# Patient Record
Sex: Male | Born: 1970 | Race: White | Hispanic: No | Marital: Single | Smoking: Former smoker
Health system: Southern US, Community
[De-identification: ages and names within clinical notes are randomized; demographics above are authoritative.]

## PROBLEM LIST (undated history)

## (undated) DIAGNOSIS — I351 Nonrheumatic aortic (valve) insufficiency: Secondary | ICD-10-CM

## (undated) DIAGNOSIS — M1710 Unilateral primary osteoarthritis, unspecified knee: Secondary | ICD-10-CM

## (undated) DIAGNOSIS — R011 Cardiac murmur, unspecified: Secondary | ICD-10-CM

## (undated) DIAGNOSIS — L309 Dermatitis, unspecified: Secondary | ICD-10-CM

## (undated) DIAGNOSIS — G43909 Migraine, unspecified, not intractable, without status migrainosus: Secondary | ICD-10-CM

## (undated) DIAGNOSIS — M5030 Other cervical disc degeneration, unspecified cervical region: Secondary | ICD-10-CM

## (undated) DIAGNOSIS — M179 Osteoarthritis of knee, unspecified: Secondary | ICD-10-CM

## (undated) DIAGNOSIS — Z9889 Other specified postprocedural states: Secondary | ICD-10-CM

## (undated) HISTORY — DX: Other cervical disc degeneration, unspecified cervical region: M50.30

## (undated) HISTORY — DX: Cardiac murmur, unspecified: R01.1

## (undated) HISTORY — DX: Osteoarthritis of knee, unspecified: M17.9

## (undated) HISTORY — PX: TONSILECTOMY/ADENOIDECTOMY WITH MYRINGOTOMY: SHX6125

## (undated) HISTORY — DX: Nonrheumatic aortic (valve) insufficiency: I35.1

## (undated) HISTORY — DX: Migraine, unspecified, not intractable, without status migrainosus: G43.909

## (undated) HISTORY — DX: Other specified postprocedural states: Z98.890

## (undated) HISTORY — DX: Unilateral primary osteoarthritis, unspecified knee: M17.10

## (undated) HISTORY — PX: CHOLECYSTECTOMY: SHX55

## (undated) HISTORY — DX: Dermatitis, unspecified: L30.9

## (undated) HISTORY — PX: LASIK: SHX215

---

## 2013-02-24 HISTORY — PX: KNEE ARTHROPLASTY: SHX992

## 2013-02-24 HISTORY — PX: KNEE ARTHROSCOPY: SUR90

## 2014-04-14 ENCOUNTER — Inpatient Hospital Stay
Admit: 2014-04-14 | Discharge: 2014-04-15 | Disposition: A | Discharge status: Home / Self Care | Payer: BLUE CROSS/BLUE SHIELD | Attending: Emergency Medicine

## 2014-04-14 DIAGNOSIS — K5289 Other specified noninfective gastroenteritis and colitis: Secondary | ICD-10-CM

## 2014-04-14 NOTE — ED Provider Notes (Signed)
HPI Comments: Jacob Olson is a 44 y.o. male who presents ambulatory to [facility] ED with cc of N/V/D with associated body aches x yesterday. Pt also complains of chills beginning today and reports seeing [facility], at which time he had a fever of 101C and was referred to [facility] ED. Pt has been on narcotics post operatively, had his 1st BM yesterday and since then has experienced non-bloody diarrhea.  Pt reports he had BL knee surgery for meniscal repair on 04/11/14 by Dr. Rosana Hoes. Pt denies any changes to his knee such as increased erythema, warmth, pain or swelling, and notes he can ambulate without pain at home. Pt denies any hematochezia, melena, hematemesis, cough, cold, or urinary symptoms.    PCP: Roney Marion, MD  PMHx significant for: None on file  PSHx significant for: Cholecystectomy, HEENT, T&A, BL knee scope and meniscus transplant  Social Hx: -tobacco, -EtOH, -drugs    There are no other complaints, changes or physical findings at this time.    The history is provided by the patient.        History reviewed. No pertinent past medical history.    Past Surgical History:   Procedure Laterality Date   ??? Hx cholecystectomy     ??? Hx heent     ??? Hx tonsil and adenoidectomy Bilateral    ??? 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 No pertinent family history.    History     Social History   ??? Marital Status: SINGLE     Spouse Name: N/A   ??? Number of Children: N/A   ??? Years of Education: N/A     Occupational History   ??? Not on file.     Social History Main Topics   ??? Smoking status: Former Smoker   ??? Smokeless tobacco: Never Used   ??? Alcohol Use: No   ??? Drug Use: No   ??? Sexual Activity: Not on file     Other Topics Concern   ??? Not on file     Social History Narrative   ??? No narrative on file           ALLERGIES: Review of patient's allergies indicates no known allergies.      Review of Systems    Constitutional: Positive for fever and chills. Negative for activity change and appetite change.   HENT: Negative for congestion, rhinorrhea, sinus pressure, sneezing and sore throat.    Eyes: Negative for pain, discharge and visual disturbance.   Respiratory: Negative for cough and shortness of breath.    Cardiovascular: Negative for chest pain.   Gastrointestinal: Positive for nausea, vomiting and diarrhea. Negative for abdominal pain and blood in stool.   Genitourinary: Negative for dysuria, urgency, frequency, hematuria, flank pain and difficulty urinating.   Musculoskeletal: Positive for myalgias (generalized). Negative for back pain, joint swelling, arthralgias, gait problem and neck pain.   Skin: Negative for color change and rash.   Neurological: Negative for dizziness, speech difficulty, weakness, light-headedness, numbness and headaches.   Psychiatric/Behavioral: Negative for behavioral problems, confusion and agitation.   All other systems reviewed and are negative.      Filed Vitals:    04/14/14 2130 04/14/14 2200 04/14/14 2230 04/14/14 2245   BP: 114/62 119/57 118/60 114/60   Pulse:    82   Temp:       Resp:    16   Height:  Weight:       SpO2: 98% 97% 99% 99%            Physical Exam   Constitutional: He is oriented to person, place, and time. He appears well-developed and well-nourished. No distress.   HENT:   Head: Normocephalic and atraumatic.   Right Ear: External ear normal.   Left Ear: External ear normal.   Nose: Nose normal.   Mouth/Throat: Oropharynx is clear and moist. No oropharyngeal exudate.   Eyes: Conjunctivae and EOM are normal. Pupils are equal, round, and reactive to light.   Neck: Normal range of motion. Neck supple.   Cardiovascular: Normal rate, regular rhythm, normal heart sounds and intact distal pulses.    Pulmonary/Chest: Effort normal and breath sounds normal.   Abdominal: Soft. Bowel sounds are normal. There is no tenderness. There is no rebound and no guarding.    Musculoskeletal: Normal range of motion.   Neurological: He is alert and oriented to person, place, and time.   Skin: Skin is dry. There is pallor.   Cool skin  Post-operative surgical incision by BL interior patellas, linear, clean, dry, and intact  No erythema, warmth, or swelling noticed to BL patellas   Psychiatric: He has a normal mood and affect. His behavior is normal. Judgment and thought content normal.   Nursing note and vitals reviewed.       MDM  Number of Diagnoses or Management Options  Fever, unspecified fever cause:   Gastroenteritis, acute:   History of meniscal tear:   Diagnosis management comments: 44 yo WM with recent BL meniscal repair who c/o gastroenteritis like s/sx PTA. (+) leukocytosis, but no evidence of post operative infection, suspicious for gastroenteritis only (no abdominal pain/ benign abdominal exam). He is instructed to push clear fluids, small amounts frequently until improving, then advance diet as tolerated. May use Pedialyte for rehydration. May use BRAT diet. Imodium OTC prn for diarrhea. If symptoms not responding as expected or develops high fever, significant abdominal pain or bloody stool, return to ED. Reviewed s/sx of post operative infection. D/c discussed with Dr. Tamala Julian prior to official d/c. Pt aware of reasons to return to ED.          Amount and/or Complexity of Data Reviewed  Clinical lab tests: ordered and reviewed  Tests in the radiology section of CPT??: ordered and reviewed  Review and summarize past medical records: yes    Patient Progress  Patient progress: stable      Procedures    PROGRESS NOTE:  8:54 PM  Donzetta Kohut, NP attempted to see the patient at this time, but he was being taken to XR.    PROGRESS NOTE:  10:21 PM  Patient still complains of a headache following administration of IV fluids.    PROGRESS NOTE:  10:29 PM  Patient is ready to go home. Dr. Tamala Julian is aware and will evaluate.    LABORATORY TESTS:   Recent Results (from the past 12 hour(s))   URINALYSIS W/MICROSCOPIC    Collection Time: 04/14/14  7:17 PM   Result Value Ref Range    Color YELLOW/STRAW      Appearance CLEAR CLEAR      Specific gravity 1.023 1.003 - 1.030      pH (UA) 5.0 5.0 - 8.0      Protein NEGATIVE  NEG mg/dL    Glucose NEGATIVE  NEG mg/dL    Ketone NEGATIVE  NEG mg/dL    Bilirubin NEGATIVE  NEG  Blood NEGATIVE  NEG      Urobilinogen 0.2 0.2 - 1.0 EU/dL    Nitrites NEGATIVE  NEG      Leukocyte Esterase NEGATIVE  NEG      WBC 0-4 0 - 4 /hpf    RBC 0-5 0 - 5 /hpf    Epithelial cells FEW FEW /lpf    Bacteria NEGATIVE  NEG /hpf    Hyaline Cast 0-2 0 - 5 /lpf   CBC WITH AUTOMATED DIFF    Collection Time: 04/14/14  8:13 PM   Result Value Ref Range    WBC 17.2 (H) 4.1 - 11.1 K/uL    RBC 4.91 4.10 - 5.70 M/uL    HGB 15.1 12.1 - 17.0 g/dL    HCT 44.0 36.6 - 50.3 %    MCV 89.6 80.0 - 99.0 FL    MCH 30.8 26.0 - 34.0 PG    MCHC 34.3 30.0 - 36.5 g/dL    RDW 12.3 11.5 - 14.5 %    PLATELET 228 150 - 400 K/uL    NEUTROPHILS 88 (H) 32 - 75 %    LYMPHOCYTES 4 (L) 12 - 49 %    MONOCYTES 8 5 - 13 %    EOSINOPHILS 0 0 - 7 %    BASOPHILS 0 0 - 1 %    ABS. NEUTROPHILS 15.1 (H) 1.8 - 8.0 K/UL    ABS. LYMPHOCYTES 0.7 (L) 0.8 - 3.5 K/UL    ABS. MONOCYTES 1.4 (H) 0.0 - 1.0 K/UL    ABS. EOSINOPHILS 0.0 0.0 - 0.4 K/UL    ABS. BASOPHILS 0.0 0.0 - 0.1 K/UL    RBC COMMENTS NORMOCYTIC, NORMOCHROMIC      DF SMEAR SCANNED     METABOLIC PANEL, COMPREHENSIVE    Collection Time: 04/14/14  8:13 PM   Result Value Ref Range    Sodium 136 136 - 145 mmol/L    Potassium 3.8 3.5 - 5.1 mmol/L    Chloride 102 97 - 108 mmol/L    CO2 27 21 - 32 mmol/L    Anion gap 7 5 - 15 mmol/L    Glucose 103 (H) 65 - 100 mg/dL    BUN 23 (H) 6 - 20 MG/DL    Creatinine 1.06 0.70 - 1.30 MG/DL    BUN/Creatinine ratio 22 (H) 12 - 20      GFR est AA >60 >60 ml/min/1.11m2    GFR est non-AA >60 >60 ml/min/1.78m2    Calcium 8.6 8.5 - 10.1 MG/DL    Bilirubin, total 0.6 0.2 - 1.0 MG/DL     ALT 82 (H) 12 - 78 U/L    AST 35 15 - 37 U/L    Alk. phosphatase 85 45 - 117 U/L    Protein, total 7.2 6.4 - 8.2 g/dL    Albumin 4.0 3.5 - 5.0 g/dL    Globulin 3.2 2.0 - 4.0 g/dL    A-G Ratio 1.3 1.1 - 2.2     INFLUENZA A & B AG (RAPID TEST)    Collection Time: 04/14/14  9:04 PM   Result Value Ref Range    Influenza A Antigen NEGATIVE  NEG      Influenza B Antigen NEGATIVE  NEG         IMAGING RESULTS:    XR CHEST PA LAT (Final result) Result time: 04/14/14 21:04:08   ?? Final result by Rad Results In Edi (04/14/14 21:04:08)   ?? Narrative:   ?? **Final Report**  ??  ICD Codes / Adm.Diagnosis: 120 ?? / Vomiting ??Ref'd by MD  Examination: ??CR CHEST PA AND LATERAL ??- [id number] - Apr 14 2014 ??8:59PM  Accession No: ??[id number]  Reason: ??fever/ chills      REPORT:  Chest PA and lateral    History: Fever and chills    Comparison: None    Findings: ??The lungs are well expanded. ??No focal consolidation, pleural   effusion, or pneumothorax. ??The cardiomediastinal silhouette is   unremarkable. ??The visualized osseous structures are unremarkable.  ????    IMPRESSION:  No acute cardiopulmonary process.  ????    ??  ??  Signing/Reading Doctor: Bernardo Heater [phone]) ??  ApprovedBernardo Heater ([id number]) ??Apr 14 2014 ??9:01PM ?? ?? ?? ?? ?? ?? ?? ?? ?? ?? ?? ??           MEDICATIONS GIVEN:  Medications   sodium chloride 0.9 % bolus infusion 1,000 mL (0 mL IntraVENous IV Completed 04/14/14 2229)   ondansetron (ZOFRAN) injection 4 mg (4 mg IntraVENous Given 04/14/14 2129)   butalbital-acetaminophen-caffeine (FIORICET, ESGIC) per tablet 2 Tab (2 Tabs Oral Given 04/14/14 2238)       IMPRESSION:  1. Gastroenteritis, acute    2. Fever, unspecified fever cause    3. History of meniscal tear        PLAN:  1. Rx: Zofran, Protonix  2. Follow up with Dr. Rosana Hoes as needed  Return to ED if worse       DISCHARGE NOTE:  10:47 PM  The patient is ready for discharge. The patient's signs, symptoms, diagnosis, and discharge instructions have been discussed and the patient  has conveyed their understanding. The patient is to follow up as recommended or return to the ER should their symptoms worsen. Plan has been discussed and the patient is in agreement.      This note is prepared by Doy Mince, acting as Scribe for Donzetta Kohut, NP.    Donzetta Kohut, NP: The scribe's documentation has been prepared under my direction and personally reviewed by me in its entirety. I confirm that the note above accurately reflects all work, treatment, procedures, and medical decision making performed by me.

## 2014-04-14 NOTE — ED Notes (Signed)
Assumed care of pt from triage.  Pt presents to ED with chief complaint of chills, vomiting, diarrhea, fever intermittently since 1500 today.  [facility], and they sent him here to get blood work and an IV started. Pt is A&O x 4.  Pt denies any other symptoms at this time.    Pt resting comfortably on the stretcher in a position of comfort.  Pt in no acute distress at this time.  Call bell within reach.  Side rails x 2.  Cardiac monitor x 2.  Stretcher locked in the lowest position. Pt aware of plan to await for MD/PA-C/NP assessment, and pt/family verbalizes understanding.  Will continue to monitor.

## 2014-04-14 NOTE — ED Notes (Signed)
NP Chapman gave and reviewed discharge instructions with the patient.  The patient verbalized understanding.  The patient was given opportunity for questions.  Patient discharged in stable condition to the waiting room ambulatory with male visitor.

## 2014-04-15 LAB — METABOLIC PANEL, COMPREHENSIVE
A-G Ratio: 1.3 (ref 1.1–2.2)
ALT (SGPT): 82 U/L — ABNORMAL HIGH (ref 12–78)
AST (SGOT): 35 U/L (ref 15–37)
Albumin: 4 g/dL (ref 3.5–5.0)
Alk. phosphatase: 85 U/L (ref 45–117)
Anion gap: 7 mmol/L (ref 5–15)
BUN/Creatinine ratio: 22 — ABNORMAL HIGH (ref 12–20)
BUN: 23 MG/DL — ABNORMAL HIGH (ref 6–20)
Bilirubin, total: 0.6 MG/DL (ref 0.2–1.0)
CO2: 27 mmol/L (ref 21–32)
Calcium: 8.6 MG/DL (ref 8.5–10.1)
Chloride: 102 mmol/L (ref 97–108)
Creatinine: 1.06 MG/DL (ref 0.70–1.30)
GFR est AA: >60 mL/min/{1.73_m2} (ref >60)
GFR est non-AA: >60 mL/min/{1.73_m2} (ref >60)
Globulin: 3.2 g/dL (ref 2.0–4.0)
Glucose: 103 mg/dL — ABNORMAL HIGH (ref 65–100)
Potassium: 3.8 mmol/L (ref 3.5–5.1)
Protein, total: 7.2 g/dL (ref 6.4–8.2)
Sodium: 136 mmol/L (ref 136–145)

## 2014-04-15 LAB — URINALYSIS W/MICROSCOPIC
Bacteria: NEGATIVE /hpf
Bilirubin: NEGATIVE
Blood: NEGATIVE
Glucose: NEGATIVE mg/dL
Ketone: NEGATIVE mg/dL
Leukocyte Esterase: NEGATIVE
Nitrites: NEGATIVE
Protein: NEGATIVE mg/dL
Specific gravity: 1.023 (ref 1.003–1.030)
Urobilinogen: 0.2 EU/dL (ref 0.2–1.0)
pH (UA): 5 (ref 5.0–8.0)

## 2014-04-15 LAB — CBC WITH AUTOMATED DIFF
ABS. BASOPHILS: 0 10*3/uL (ref 0.0–0.1)
ABS. EOSINOPHILS: 0 10*3/uL (ref 0.0–0.4)
ABS. LYMPHOCYTES: 0.7 10*3/uL — ABNORMAL LOW (ref 0.8–3.5)
ABS. MONOCYTES: 1.4 10*3/uL — ABNORMAL HIGH (ref 0.0–1.0)
ABS. NEUTROPHILS: 15.1 10*3/uL — ABNORMAL HIGH (ref 1.8–8.0)
BASOPHILS: 0 % (ref 0–1)
EOSINOPHILS: 0 % (ref 0–7)
HCT: 44 % (ref 36.6–50.3)
HGB: 15.1 g/dL (ref 12.1–17.0)
LYMPHOCYTES: 4 % — ABNORMAL LOW (ref 12–49)
MCH: 30.8 PG (ref 26.0–34.0)
MCHC: 34.3 g/dL (ref 30.0–36.5)
MCV: 89.6 FL (ref 80.0–99.0)
MONOCYTES: 8 % (ref 5–13)
NEUTROPHILS: 88 % — ABNORMAL HIGH (ref 32–75)
PLATELET: 228 10*3/uL (ref 150–400)
RBC: 4.91 M/uL (ref 4.10–5.70)
RDW: 12.3 % (ref 11.5–14.5)
WBC: 17.2 10*3/uL — ABNORMAL HIGH (ref 4.1–11.1)

## 2014-04-15 LAB — INFLUENZA A & B AG (RAPID TEST)
Influenza A Antigen: NEGATIVE
Influenza B Antigen: NEGATIVE

## 2014-04-15 MED ORDER — ONDANSETRON (PF) 4 MG/2 ML INJECTION
4 mg/2 mL | INTRAMUSCULAR | Status: AC
Start: 2014-04-15 — End: 2014-04-14
  Administered 2014-04-15: 02:00:00 via INTRAVENOUS

## 2014-04-15 MED ORDER — SODIUM CHLORIDE 0.9% BOLUS IV
0.9 % | Freq: Once | INTRAVENOUS | Status: AC
Start: 2014-04-15 — End: 2014-04-14
  Administered 2014-04-15: 02:00:00 via INTRAVENOUS

## 2014-04-15 MED ORDER — PANTOPRAZOLE 40 MG TAB, DELAYED RELEASE
40 mg | ORAL_TABLET | Freq: Every day | ORAL | Status: AC
Start: 2014-04-15 — End: 2014-05-04

## 2014-04-15 MED ORDER — ONDANSETRON 4 MG TAB, RAPID DISSOLVE
4 mg | ORAL | Status: DC
Start: 2014-04-15 — End: 2014-04-14

## 2014-04-15 MED ORDER — BUTALBITAL-ACETAMINOPHEN-CAFFEINE 50 MG-325 MG-40 MG TAB
50-325-40 mg | ORAL | Status: AC
Start: 2014-04-15 — End: 2014-04-14
  Administered 2014-04-15: 04:00:00 via ORAL

## 2014-04-15 MED ORDER — ONDANSETRON 8 MG TAB, RAPID DISSOLVE
8 mg | ORAL_TABLET | Freq: Three times a day (TID) | ORAL | Status: AC | PRN
Start: 2014-04-15 — End: ?

## 2014-04-15 MED FILL — SODIUM CHLORIDE 0.9 % IV: INTRAVENOUS | Qty: 1000

## 2014-04-15 MED FILL — ONDANSETRON (PF) 4 MG/2 ML INJECTION: 4 mg/2 mL | INTRAMUSCULAR | Qty: 2

## 2014-04-15 MED FILL — BUTALBITAL-ACETAMINOPHEN-CAFFEINE 50 MG-325 MG-40 MG TAB: 50-325-40 mg | ORAL | Qty: 2

## 2015-10-25 ENCOUNTER — Ambulatory Visit (INDEPENDENT_AMBULATORY_CARE_PROVIDER_SITE_OTHER): Payer: BLUE CROSS/BLUE SHIELD | Admitting: Sports Medicine

## 2015-10-25 ENCOUNTER — Ambulatory Visit (INDEPENDENT_AMBULATORY_CARE_PROVIDER_SITE_OTHER): Payer: BLUE CROSS/BLUE SHIELD

## 2015-10-25 DIAGNOSIS — M170 Bilateral primary osteoarthritis of knee: Secondary | ICD-10-CM | POA: Insufficient documentation

## 2015-10-25 DIAGNOSIS — M25442 Effusion, left hand: Secondary | ICD-10-CM | POA: Diagnosis not present

## 2015-10-25 DIAGNOSIS — M25542 Pain in joints of left hand: Secondary | ICD-10-CM | POA: Diagnosis not present

## 2015-10-25 DIAGNOSIS — M1812 Unilateral primary osteoarthritis of first carpometacarpal joint, left hand: Secondary | ICD-10-CM | POA: Insufficient documentation

## 2015-10-25 MED ORDER — MELOXICAM 15 MG PO TABS: ORAL_TABLET | ORAL | 3 refills | Status: DC

## 2015-10-25 NOTE — Progress Notes (Signed)
Subjective:    I'm seeing this patient as a consultation for:  Dr. Leanne LovelyKenneth Charles Livingston  CC: Knee pain and hand pain  HPI: Bilateral knee pain: With known osteoarthritis on x-rays at an outside facility, he is post arthroscopic meniscal debridement, he was given a shot of Synvisc 1 sometime ago, and had persistent pain, he describes the pain is predominantly under the kneecap, worse with squatting and going up and down stairs. Has never had physical therapy, not taking any NSAIDs.  Left hand pain: Localized at the base of the thumb, moderate, persistent without radiation.  Past medical history, Surgical history, Family history not pertinant except as noted below, Social history, Allergies, and medications have been entered into the medical record, reviewed, and no changes needed.   Review of Systems: No headache, visual changes, nausea, vomiting, diarrhea, constipation, dizziness, abdominal pain, skin rash, fevers, chills, night sweats, weight loss, swollen lymph nodes, body aches, joint swelling, muscle aches, chest pain, shortness of breath, mood changes, visual or auditory hallucinations.   Objective:   General: Well Developed, well nourished, and in no acute distress.  Neuro/Psych: Alert and oriented x3, extra-ocular muscles intact, able to move all 4 extremities, sensation grossly intact. Skin: Warm and dry, no rashes noted.  Respiratory: Not using accessory muscles, speaking in full sentences, trachea midline.  Cardiovascular: Pulses palpable, no extremity edema. Abdomen: Does not appear distended. Bilateral knees: Tender to palpation under the medial and lateral patellar facets ROM normal in flexion and extension and lower leg rotation. Ligaments with solid consistent endpoints including ACL, PCL, LCL, MCL. Negative Mcmurray's and provocative meniscal tests. Non painful patellar compression. Patellar and quadriceps tendons unremarkable. Hamstring and quadriceps strength is  normal. Left Wrist: Inspection normal with no visible erythema or swelling. ROM smooth and normal with good flexion and extension and ulnar/radial deviation that is symmetrical with opposite wrist. Palpation is normal over metacarpals, navicular, lunate, and TFCC; tendons without tenderness/ swelling No snuffbox tenderness. No tenderness over Canal of Guyon. Strength 5/5 in all directions without pain. Negative Finkelstein, tinel's and phalens. Negative Watson's test. Tenderness to palpation at the thumb basal joint  Procedure: Real-time Ultrasound Guided Injection of left knee Device: GE Logiq E  Verbal informed consent obtained.  Time-out conducted.  Noted no overlying erythema, induration, or other signs of local infection.  Skin prepped in a sterile fashion.  Local anesthesia: Topical Ethyl chloride.  With sterile technique and under real time ultrasound guidance:  1 mL kenalog 40, 2 mL lidocaine, 2 mL Marcaine injected easily. Completed without difficulty  Pain immediately resolved suggesting accurate placement of the medication.  Advised to call if fevers/chills, erythema, induration, drainage, or persistent bleeding.  Images permanently stored and available for review in the ultrasound unit.  Impression: Technically successful ultrasound guided injection.  Procedure: Real-time Ultrasound Guided Injection of right knee Device: GE Logiq E  Verbal informed consent obtained.  Time-out conducted.  Noted no overlying erythema, induration, or other signs of local infection.  Skin prepped in a sterile fashion.  Local anesthesia: Topical Ethyl chloride.  With sterile technique and under real time ultrasound guidance:  1 mL kenalog 40, 2 mL lidocaine, 2 mL Marcaine injected easily. Completed without difficulty  Pain immediately resolved suggesting accurate placement of the medication.  Advised to call if fevers/chills, erythema, induration, drainage, or persistent bleeding.  Images  permanently stored and available for review in the ultrasound unit.  Impression: Technically successful ultrasound guided injection.  Procedure: Real-time Ultrasound Guided Injection  of left thumb basal joint Device: GE Logiq E  Verbal informed consent obtained.  Time-out conducted.  Noted no overlying erythema, induration, or other signs of local infection.  Skin prepped in a sterile fashion.  Local anesthesia: Topical Ethyl chloride.  With sterile technique and under real time ultrasound guidance:  30-gauge needle advanced between the base of the metacarpal and the trapezium, 1/2 mL Kenalog 40, 1/2 mL lidocaine injected easily. Completed without difficulty  Pain immediately resolved suggesting accurate placement of the medication.  Advised to call if fevers/chills, erythema, induration, drainage, or persistent bleeding.  Images permanently stored and available for review in the ultrasound unit.  Impression: Technically successful ultrasound guided injection.  Impression and Recommendations:   This case required medical decision making of moderate complexity.  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  Primary osteoarthritis of both knees Synvisc 1 injections sometime ago, also has had operative arthroscopic debridement of a meniscal tear in both knees. X-rays at an outside facility showed moderate osteoarthritis. Bilateral steroid injections today, physical therapy, meloxicam, return for custom orthotics. I'm also going to get him approved for Orthovisc.

## 2015-10-25 NOTE — Assessment & Plan Note (Signed)
Synvisc 1 injections sometime ago, also has had operative arthroscopic debridement of a meniscal tear in both knees. X-rays at an outside facility showed moderate osteoarthritis. Bilateral steroid injections today, physical therapy, meloxicam, return for custom orthotics. I'm also going to get him approved for Orthovisc.

## 2015-10-25 NOTE — Assessment & Plan Note (Signed)
Injection as above, x-rays, meloxicam.

## 2015-11-01 ENCOUNTER — Encounter: Payer: Self-pay | Admitting: Osteopathic Medicine

## 2015-11-01 ENCOUNTER — Ambulatory Visit (INDEPENDENT_AMBULATORY_CARE_PROVIDER_SITE_OTHER): Payer: BLUE CROSS/BLUE SHIELD | Admitting: Osteopathic Medicine

## 2015-11-01 VITALS — BP 125/69 | HR 72 | Wt 190.0 lb

## 2015-11-01 DIAGNOSIS — R9431 Abnormal electrocardiogram [ECG] [EKG]: Secondary | ICD-10-CM | POA: Insufficient documentation

## 2015-11-01 DIAGNOSIS — I5189 Other ill-defined heart diseases: Secondary | ICD-10-CM | POA: Insufficient documentation

## 2015-11-01 DIAGNOSIS — Z0000 Encounter for general adult medical examination without abnormal findings: Secondary | ICD-10-CM

## 2015-11-01 DIAGNOSIS — R079 Chest pain, unspecified: Secondary | ICD-10-CM | POA: Insufficient documentation

## 2015-11-01 DIAGNOSIS — L918 Other hypertrophic disorders of the skin: Secondary | ICD-10-CM | POA: Insufficient documentation

## 2015-11-01 DIAGNOSIS — R011 Cardiac murmur, unspecified: Secondary | ICD-10-CM | POA: Insufficient documentation

## 2015-11-01 DIAGNOSIS — L089 Local infection of the skin and subcutaneous tissue, unspecified: Secondary | ICD-10-CM | POA: Diagnosis not present

## 2015-11-01 DIAGNOSIS — I517 Cardiomegaly: Secondary | ICD-10-CM | POA: Insufficient documentation

## 2015-11-01 NOTE — Progress Notes (Signed)
HPI: Ryan Livingston is a 45 y.o. male  who presents to [facility] today, 11/01/15,  for chief complaint of:  Chief Complaint  Patient presents with  . Establish Care    Recently seen in our office for sports medicine issue by my colleague Dr Benjamin Stainhekkekandam for arthritis issues. He'll be following up with him again due to foot pain/question need for orthotic  C/o feet swelling on occasion and occasional SOB. Upcoming appt with cardiology today for "leaky valve."    *Complains of some skin tags, one on right side of neck has been particularly bothersome and painful for him. He would like some skin tags removed today if possible  Patient is otherwise feeling well at this time. Had blood work a few months ago. Patient states that everything was normal on blood work that we do not have results available frustrated now. See below for review of preventive care    Past medical, surgical, social and family history reviewed: No past medical history on file. No past surgical history on file. Social History  Substance Use Topics  . Smoking status: Not on file  . Smokeless tobacco: Not on file  . Alcohol use Not on file   No family history on file.   Current medication list and allergy/intolerance information reviewed:   Current Outpatient Prescriptions  Medication Sig Dispense Refill  . meloxicam (MOBIC) 15 MG tablet One tab PO qAM with breakfast for 2 weeks, then daily prn pain. 30 tablet 3   No current facility-administered medications for this visit.    No Known Allergies    Review of Systems:  Constitutional:  No  fever, no chills, No recent illness, No unintentional weight changes. No significant fatigue.   HEENT: (+) occasional headache, no vision change, no hearing change, No sore throat, No  sinus pressure  Cardiac: No  chest pain, No  pressure, No palpitations, No  Orthopnea  Respiratory:  (+) occasional shortness of breath. No   Cough  Gastrointestinal: No  abdominal pain, No  nausea, No  vomiting,  No  blood in stool, No  diarrhea, No  constipation   Musculoskeletal: No new myalgia/arthralgia  Genitourinary: No  incontinence, No  abnormal genital bleeding, No abnormal genital discharge  Skin: No  Rash, No other wounds/concerning lesions  Hem/Onc: No  easy bruising/bleeding, No  abnormal lymph node  Endocrine: No cold intolerance,  No heat intolerance. No polyuria/polydipsia/polyphagia   Neurologic: No  weakness, No  dizziness, No  slurred speech/focal weakness/facial droop  Psychiatric: No  concerns with depression, No  concerns with anxiety, No sleep problems, No mood problems  Exam:  BP 125/69   Pulse 72   Wt 190 lb (86.2 kg)   Constitutional: VS see above. General Appearance: alert, well-developed, well-nourished, NAD  Eyes: Normal lids and conjunctive, non-icteric sclera  Ears, Nose, Mouth, Throat: MMM, Normal external inspection ears/nares/mouth/lips/gums. TM normal bilaterally. Pharynx/tonsils no erythema, no exudate. Nasal mucosa normal.   Neck: No masses, trachea midline. No thyroid enlargement. No tenderness/mass appreciated. No lymphadenopathy  Respiratory: Normal respiratory effort. no wheeze, no rhonchi, no rales  Cardiovascular: S1/S2 normal, no murmur, no rub/gallop auscultated. RRR. No lower extremity edema. Pedal pulse II/IV bilaterally DP and PT. No carotid bruit or JVD. No abdominal aortic bruit.  Gastrointestinal: Nontender, no masses. No hepatomegaly, no splenomegaly. No hernia appreciated. Bowel sounds normal. Rectal exam deferred.   Musculoskeletal: Gait normal. No clubbing/cyanosis of digits.   Neurological: No cranial nerve deficit on limited  exam. Motor and sensation intact and symmetric. Cerebellar reflexes intact. Normal balance/coordination. No tremor.   Skin: warm, dry, intact. No rash/ulcer. No concerning nevi or subq nodules on limited exam.  Inflamed skin tag R neck,  appears benign, other skin tags R and L neck appear benign  Psychiatric: Normal judgment/insight. Normal mood and affect. Oriented x3.      ASSESSMENT/PLAN:    Annual physical exam  Inflamed skin tag*  *Procedure: Skin Tag Removal x6 Procedure was explained to patient and informed consent obtained to proceed. Larger skin tag on right neck was anesthetized with 2% lidocaine with epi, other skin tags were and if it has this cold spray and all were removed using forceps and scissors. Hemostasis obtained. Patient tolerated procedure well.   MALE PREVENTIVE CARE updated 11/01/15  ANNUAL SCREENING/COUNSELING  Any changes to health in the past year? no  Tobacco - quit in 1989   Alcohol - none  Diet/Exercise - Healthy habits discussed to decrease CV risk and promote overall health. Patient does not have dietary restrictions.   Depression - PQH2 Negative  Feel safe at home? - yes  HTN SCREENING - SEE VITALS  SEXUAL/REPRODUCTIVE HEALTH  Sexually active? - Yes with male.  STI testing needed/desired today? - no  Any concerns with testosterone/libido? - no  INFECTIOUS DISEASE SCREENING  HIV - needs but declines  GC/CT - does not need  HepC - does not need  TB - does not need  CANCER SCREENING  Lung - does not need  Colon - does not need - had colonoscopy done, not sure reason but (+) polyps, done in [place], pt unclear on follow-up  Prostate - does not need  OTHER DISEASE SCREENING  Lipid - does not need - we need records  DM2 - does not need - we need records  Osteoporosis - does not need  ADULT VACCINATION  Influenza - does not need today, annual vaccine recommended  Td - already has - pt reports done 2017  Zoster - was not indicated  Pneumonia - was not indicated     Visit summary with medication list and pertinent instructions was printed for patient to review. All questions at time of visit were answered - patient instructed to contact office  with any additional concerns. ER/RTC precautions were reviewed with the patient. Follow-up plan: Return in about 1 year (around 10/31/2016), or soonre if needed to address any problems that may come up, for ANNUAL WELLNESS PHYSICAL.  *Note for problem-based visit for skin tags: Total time spent 10 minutes, greater than 50% of the visit was spent face-to-face counseling and coordinating care for the following: Inflamed skin tags

## 2015-11-01 NOTE — Patient Instructions (Addendum)
It would be helpful for [place] to have records of your most recent lab work results and your immunization history, as well as report from your colonoscopy - please make sure you have signed a records release for [place] to obtain this information. Otherwise, you are caught up on routine preventive care measures. You'll be due for colon cancer screening at age 45. We recommended annual flu vaccine. You should be seen once a year for complete physical and routine blood work including cholesterol screening, diabetes screening, and other labs.   Please let us know if there is anything else we can do for you! Take care! Dr. Mervyn Skeeters.    Heart-Healthy Eating Plan Many factors influence your heart health, including eating and exercise habits. Heart (coronary) risk increases with abnormal blood fat (lipid) levels. Heart-healthy meal planning includes limiting unhealthy fats, increasing healthy fats, and making other small dietary changes. This includes maintaining a healthy body weight to help keep lipid levels within a normal range. WHAT TYPES OF FAT SHOULD I CHOOSE?  Choose healthy fats more often. Choose monounsaturated and polyunsaturated fats, such as olive oil and canola oil, flaxseeds, walnuts, almonds, and seeds.  Eat more omega-3 fats. Good choices include salmon, mackerel, sardines, tuna, flaxseed oil, and ground flaxseeds. Aim to eat fish at least two times each week.  Limit saturated fats. Saturated fats are primarily found in animal products, such as meats, butter, and cream. Plant sources of saturated fats include palm oil, palm kernel oil, and coconut oil.  Avoid foods with partially hydrogenated oils in them. These contain trans fats. Examples of foods that contain trans fats are stick margarine, some tub margarines, cookies, crackers, and other baked goods. WHAT GENERAL GUIDELINES DO I NEED TO FOLLOW?  Check food labels carefully to identify foods with trans fats or high amounts of saturated fat.  Fill  one half of your plate with vegetables and green salads. Eat 4-5 servings of vegetables per day. A serving of vegetables equals 1 cup of raw leafy vegetables,  cup of raw or cooked cut-up vegetables, or  cup of vegetable juice.  Fill one fourth of your plate with whole grains. Look for the word "whole" as the first word in the ingredient list.  Fill one fourth of your plate with lean protein foods.  Eat 4-5 servings of fruit per day. A serving of fruit equals one medium whole fruit,  cup of dried fruit,  cup of fresh, frozen, or canned fruit, or  cup of 100% fruit juice.  Eat more foods that contain soluble fiber. Examples of foods that contain this type of fiber are apples, broccoli, carrots, beans, peas, and barley. Aim to get 20-30 g of fiber per day.  Eat more home-cooked food and less restaurant, buffet, and fast food.  Limit or avoid alcohol.  Limit foods that are high in starch and sugar.  Avoid fried foods.  Cook foods by using methods other than frying. Baking, boiling, grilling, and broiling are all great options. Other fat-reducing suggestions include:  Removing the skin from poultry.  Removing all visible fats from meats.  Skimming the fat off of stews, soups, and gravies before serving them.  Steaming vegetables in water or broth.  Lose weight if you are overweight. Losing just 5-10% of your initial body weight can help your overall health and prevent diseases such as diabetes and heart disease.  Increase your consumption of nuts, legumes, and seeds to 4-5 servings per week. One serving of dried beans or  legumes equals  cup after being cooked, one serving of nuts equals 1 ounces, and one serving of seeds equals  ounce or 1 tablespoon.  You may need to monitor your salt (sodium) intake, especially if you have high blood pressure. Talk with your health care provider or dietitian to get more information about reducing sodium. WHAT FOODS CAN I EAT? Grains Breads,  including [place], white, pita, wheat, raisin, rye, oatmeal, and [place]. Tortillas that are neither fried nor made with lard or trans fat. Low-fat rolls, including hotdog and hamburger buns and English muffins. Biscuits. Muffins. Waffles. Pancakes. Light popcorn. Whole-grain cereals. Flatbread. Melba toast. Pretzels. Breadsticks. Rusks. Low-fat snacks and crackers, including oyster, saltine, matzo, graham, animal, and rye. Rice and pasta, including brown rice and those that are made with whole wheat. Vegetables All vegetables. Fruits All fruits, but limit coconut. Meats and Other Protein Sources Lean, well-trimmed beef, veal, pork, and lamb. Chicken and [place] without skin. All fish and shellfish. Wild duck, rabbit, pheasant, and venison. Egg whites or low-cholesterol egg substitutes. Dried beans, peas, lentils, and tofu.Seeds and most nuts. Dairy Low-fat or nonfat cheeses, including ricotta, string, and mozzarella. Skim or 1% milk that is liquid, powdered, or evaporated. Buttermilk that is made with low-fat milk. Nonfat or low-fat yogurt. Beverages Mineral water. Diet carbonated beverages. Sweets and Desserts Sherbets and fruit ices. Honey, jam, marmalade, jelly, and syrups. Meringues and gelatins. Pure sugar candy, such as hard candy, jelly beans, gumdrops, mints, marshmallows, and small amounts of dark chocolate. [employer]. Eat all sweets and desserts in moderation. Fats and Oils Nonhydrogenated (trans-free) margarines. Vegetable oils, including soybean, sesame, sunflower, olive, peanut, safflower, corn, canola, and cottonseed. Salad dressings or mayonnaise that are made with a vegetable oil. Limit added fats and oils that you use for cooking, baking, salads, and as spreads. Other Cocoa powder. Coffee and tea. All seasonings and condiments. The items listed above may not be a complete list of recommended foods or beverages. Contact your dietitian for more options. WHAT FOODS ARE NOT  RECOMMENDED? Grains Breads that are made with saturated or trans fats, oils, or whole milk. Croissants. Butter rolls. Cheese breads. Sweet rolls. Donuts. Buttered popcorn. Chow mein noodles. High-fat crackers, such as cheese or butter crackers. Meats and Other Protein Sources Fatty meats, such as hotdogs, short ribs, sausage, spareribs, bacon, ribeye roast or steak, and mutton. High-fat deli meats, such as salami and bologna. Caviar. Domestic duck and goose. Organ meats, such as kidney, liver, sweetbreads, brains, gizzard, chitterlings, and heart. Dairy Cream, sour cream, cream cheese, and creamed cottage cheese. Whole milk cheeses, including blue (bleu), [address], [place], [place], [address], [place], [address], [place], [place], and [place]. Whole or 2% milk that is liquid, evaporated, or condensed. Whole buttermilk. Cream sauce or high-fat cheese sauce. Yogurt that is made from whole milk. Beverages Regular sodas and drinks with added sugar. Sweets and Desserts Frosting. Pudding. Cookies. Cakes other than angel food cake. Candy that has milk chocolate or white chocolate, hydrogenated fat, butter, coconut, or unknown ingredients. Buttered syrups. Full-fat ice cream or ice cream drinks. Fats and Oils Gravy that has suet, meat fat, or shortening. Cocoa butter, hydrogenated oils, palm oil, coconut oil, palm kernel oil. These can often be found in baked products, candy, fried foods, nondairy creamers, and whipped toppings. Solid fats and shortenings, including bacon fat, salt pork, lard, and butter. Nondairy cream substitutes, such as coffee creamers and sour cream substitutes. Salad dressings that are made of unknown oils, cheese, or sour  cream. The items listed above may not be a complete list of foods and beverages to avoid. Contact your dietitian for more information.   This information is not intended to replace advice given to you by your health care provider. Make sure you discuss any questions  you have with your health care provider.   Document Released: 11/20/2007 Document Revised: 03/03/2014 Document Reviewed: 08/04/2013 Elsevier Interactive Patient Education [employer]     Why follow it? Research shows. . Those who follow the Mediterranean diet have a reduced risk of heart disease  . The diet is associated with a reduced incidence of Parkinson's and Alzheimer's diseases . People following the diet may have longer life expectancies and lower rates of chronic diseases  . The Dietary Guidelines for Americans recommends the Mediterranean diet as an eating plan to promote health and prevent disease  What Is the Mediterranean Diet?  . Healthy eating plan based on typical foods and recipes of Mediterranean-style cooking . The diet is primarily a plant based diet; these foods should make up a majority of meals   Starches - Plant based foods should make up a majority of meals - They are an important sources of vitamins, minerals, energy, antioxidants, and fiber - Choose whole grains, foods high in fiber and minimally processed items  - Typical grain sources include wheat, oats, barley, corn, brown rice, bulgar, farro, millet, polenta, couscous  - Various types of beans include chickpeas, lentils, fava beans, black beans, white beans   Fruits  Veggies - Large quantities of antioxidant rich fruits & veggies; 6 or more servings  - Vegetables can be eaten raw or lightly drizzled with oil and cooked  - Vegetables common to the traditional Mediterranean Diet include: artichokes, arugula, beets, broccoli, brussel sprouts, cabbage, carrots, celery, collard greens, cucumbers, eggplant, kale, leeks, lemons, lettuce, mushrooms, okra, onions, peas, peppers, potatoes, pumpkin, radishes, rutabaga, shallots, spinach, sweet potatoes, turnips, zucchini - Fruits common to the Mediterranean Diet include: apples, apricots, avocados, cherries, clementines, dates, figs, grapefruits, grapes, melons,  nectarines, oranges, peaches, pears, pomegranates, strawberries, tangerines  Fats - Replace butter and margarine with healthy oils, such as olive oil, canola oil, and tahini  - Limit nuts to no more than a handful a day  - Nuts include walnuts, almonds, pecans, pistachios, pine nuts  - Limit or avoid candied, honey roasted or heavily salted nuts - Olives are central to the [employer] - can be eaten whole or used in a variety of dishes   Meats Protein - Limiting red meat: no more than a few times a month - When eating red meat: choose lean cuts and keep the portion to the size of deck of cards - Eggs: approx. 0 to 4 times a week  - Fish and lean poultry: at least 2 a week  - Healthy protein sources include, chicken, [place], lean beef, lamb - Increase intake of seafood such as tuna, salmon, trout, mackerel, shrimp, scallops - Avoid or limit high fat processed meats such as sausage and bacon  Dairy - Include moderate amounts of low fat dairy products  - Focus on healthy dairy such as fat free yogurt, skim milk, low or reduced fat cheese - Limit dairy products higher in fat such as whole or 2% milk, cheese, ice cream  Alcohol - Moderate amounts of red wine is ok  - No more than 5 oz daily for women (all ages) and men older than age 73  - No more than 10  oz of wine daily for men younger than 5565  Other - Limit sweets and other desserts  - Use herbs and spices instead of salt to flavor foods  - Herbs and spices common to the traditional Mediterranean Diet include: basil, bay leaves, chives, cloves, cumin, fennel, garlic, lavender, marjoram, mint, oregano, parsley, pepper, rosemary, sage, savory, sumac, tarragon, thyme   It's not just a diet, it's a lifestyle:  . The Mediterranean diet includes lifestyle factors typical of those in the region  . Foods, drinks and meals are best eaten with others and savored . Daily physical activity is important for overall good health . This could be  strenuous exercise like running and aerobics . This could also be more leisurely activities such as walking, housework, yard-work, or taking the stairs . Moderation is the key; a balanced and healthy diet accommodates most foods and drinks . Consider portion sizes and frequency of consumption of certain foods   Meal Ideas & Options:  . Breakfast:  o Whole wheat toast or whole wheat English muffins with peanut butter & hard boiled egg o Steel cut oats topped with apples & cinnamon and skim milk  o Fresh fruit: banana, strawberries, melon, berries, peaches  o Smoothies: strawberries, bananas, greek yogurt, peanut butter o Low fat greek yogurt with blueberries and granola  o Egg white omelet with spinach and mushrooms o Breakfast couscous: whole wheat couscous, apricots, skim milk, cranberries  . Sandwiches:  o Hummus and grilled vegetables (peppers, zucchini, squash) on whole wheat bread   o Grilled chicken on whole wheat pita with lettuce, tomatoes, cucumbers or tzatziki  o Tuna salad on whole wheat bread: tuna salad made with greek yogurt, olives, red peppers, capers, green onions o Garlic rosemary lamb pita: lamb sauted with garlic, rosemary, salt & pepper; add lettuce, cucumber, greek yogurt to pita - flavor with lemon juice and black pepper  . Seafood:  o Mediterranean grilled salmon, seasoned with garlic, basil, parsley, lemon juice and black pepper o Shrimp, lemon, and spinach whole-grain pasta salad made with low fat greek yogurt  o Seared scallops with lemon orzo  o Seared tuna steaks seasoned salt, pepper, coriander topped with tomato mixture of olives, tomatoes, olive oil, minced garlic, parsley, green onions and cappers  . Meats:  o Herbed greek chicken salad with kalamata olives, cucumber, feta  o Red bell peppers stuffed with spinach, bulgur, lean ground beef (or lentils) & topped with feta   o Kebabs: skewers of chicken, tomatoes, onions, zucchini, squash  o [place] burgers:  made with red onions, mint, dill, lemon juice, feta cheese topped with roasted red peppers . Vegetarian o Cucumber salad: cucumbers, artichoke hearts, celery, red onion, feta cheese, tossed in olive oil & lemon juice  o Hummus and whole grain pita points with a greek salad (lettuce, tomato, feta, olives, cucumbers, red onion) o Lentil soup with celery, carrots made with vegetable broth, garlic, salt and pepper  o Tabouli salad: parsley, bulgur, mint, scallions, cucumbers, tomato, radishes, lemon juice, olive oil, salt and pepper.

## 2015-11-01 NOTE — Assessment & Plan Note (Signed)
MALE PREVENTIVE CARE updated 11/01/15  ANNUAL SCREENING/COUNSELING  Any changes to health in the past year? no  Tobacco - quit in 1989   Alcohol - none  Diet/Exercise - Healthy habits discussed to decrease CV risk and promote overall health. Patient does not have dietary restrictions.   Depression - PQH2 Negative  Feel safe at home? - yes  HTN SCREENING - SEE VITALS  SEXUAL/REPRODUCTIVE HEALTH  Sexually active? - Yes with male.  STI testing needed/desired today? - no  Any concerns with testosterone/libido? - no  INFECTIOUS DISEASE SCREENING  HIV - needs but declines  GC/CT - does not need  HepC - does not need  TB - does not need  CANCER SCREENING  Lung - does not need  Colon - does not need - had colonoscopy done, not sure reason but (+) polyps, [place] in [place], pt unclear on follow-up  Prostate - does not need  OTHER DISEASE SCREENING  Lipid - does not need - we need records  DM2 - does not need - we need records  Osteoporosis - does not need  ADULT VACCINATION  Influenza - does not need today, annual vaccine recommended  Td - already has - pt reports done 2017  Zoster - was not indicated  Pneumonia - was not indicated

## 2015-11-02 ENCOUNTER — Encounter: Payer: BLUE CROSS/BLUE SHIELD | Admitting: Sports Medicine

## 2015-11-21 ENCOUNTER — Ambulatory Visit (INDEPENDENT_AMBULATORY_CARE_PROVIDER_SITE_OTHER): Payer: BLUE CROSS/BLUE SHIELD | Admitting: Sports Medicine

## 2015-11-21 ENCOUNTER — Encounter: Payer: Self-pay | Admitting: Sports Medicine

## 2015-11-21 ENCOUNTER — Telehealth: Payer: Self-pay | Admitting: Sports Medicine

## 2015-11-21 DIAGNOSIS — M170 Bilateral primary osteoarthritis of knee: Secondary | ICD-10-CM

## 2015-11-21 NOTE — Telephone Encounter (Signed)
Submitted for approval on Orthovisc. Awaiting confirmation.  

## 2015-11-21 NOTE — Assessment & Plan Note (Signed)
Good response with 75% improvement after bilateral steroid injections.  Still with some pain, we will add bilateral reaction knee braces. I'm also going to get him improved for Orthovisc. Return to start injections.

## 2015-11-21 NOTE — Progress Notes (Signed)
  Subjective:    CC: Follow-up  HPI: Carpometacarpal osteoarthritis: Resolved after injection.  Bilateral knee osteoarthritis: 75% better after bilateral steroid injection, he is agreeable to try bilateral knee braces and would like to proceed with Orthovisc.  Past medical history:  Negative.  See flowsheet/record as well for more information.  Surgical history: Negative.  See flowsheet/record as well for more information.  Family history: Negative.  See flowsheet/record as well for more information.  Social history: Negative.  See flowsheet/record as well for more information.  Allergies, and medications have been entered into the medical record, reviewed, and no changes needed.   Review of Systems: No fevers, chills, night sweats, weight loss, chest pain, or shortness of breath.   Objective:    General: Well Developed, well nourished, and in no acute distress.  Neuro: Alert and oriented x3, extra-ocular muscles intact, sensation grossly intact.  HEENT: Normocephalic, atraumatic, pupils equal round reactive to light, neck supple, no masses, no lymphadenopathy, thyroid nonpalpable.  Skin: Warm and dry, no rashes. Cardiac: Regular rate and rhythm, no murmurs rubs or gallops, no lower extremity edema.  Respiratory: Clear to auscultation bilaterally. Not using accessory muscles, speaking in full sentences.  Impression and Recommendations:    Primary osteoarthritis of both knees Good response with 75% improvement after bilateral steroid injections.  Still with some pain, we will add bilateral reaction knee braces. I'm also going to get him improved for Orthovisc. Return to start injections.  I spent 25 minutes with this patient, greater than 50% was face-to-face time counseling regarding the above diagnoses

## 2015-11-21 NOTE — Telephone Encounter (Signed)
-----   Message from Monica Bectonhomas J Thekkekandam, MD sent at 11/21/2015  9:44 AM EDT ----- Bilateral Orthovisc approval please, x-ray confirmed osteoarthritis, failed NSAIDs and steroid injections. ___________________________________________ Ihor Austinhomas J. Benjamin Stainhekkekandam, M.D., ABFM., CAQSM. Primary Care and Sports Medicine [facility]  Adjunct Instructor of Family Medicine  [employer]

## 2015-11-22 MED ORDER — SODIUM HYALURONATE (VISCOSUP) 20 MG/2ML IX SOSY: PREFILLED_SYRINGE | INTRA_ARTICULAR | 0 refills | Status: DC

## 2015-11-22 NOTE — Telephone Encounter (Signed)
Received the following from OV benefits investigation:  Patient has a self funded PPO plan with an effective date of 02/25/2015. Orthovisc is not the preferred drug through [facility] and will not be covered until patient has tried and failed with the preferred drug. Pre-cert would be required after trying and failing with preferred, pre-cert phone # is [phone]. [id number] is covered at 70% & [id number] is covered at 70% office visits are covered at 70% of the contracted rate when performed in an office setting. *Deductible must be met for coverage to apply. If out of pocket is met, coverage goes to 100%. Ref# [id number]  Will route to Provider to send preferred to [place]. Left VM advising Pt to be expecting phone call.

## 2015-11-22 NOTE — Telephone Encounter (Signed)
Euflexxa sent to [facility].

## 2015-11-22 NOTE — Telephone Encounter (Signed)
Submitted PA via CoverMyMeds for Euflexxa.

## 2015-11-22 NOTE — Addendum Note (Signed)
Addended by: Monica BectonHEKKEKANDAM, Holden Draughon J on: 11/22/2015 11:55 AM   Modules accepted: Orders

## 2015-11-27 ENCOUNTER — Other Ambulatory Visit: Payer: Self-pay | Admitting: Sports Medicine

## 2015-11-27 MED ORDER — SODIUM HYALURONATE (VISCOSUP) 20 MG/2ML IX SOSY: PREFILLED_SYRINGE | INTRA_ARTICULAR | 0 refills | Status: DC

## 2015-11-30 ENCOUNTER — Other Ambulatory Visit: Payer: Self-pay

## 2015-11-30 ENCOUNTER — Encounter: Payer: Self-pay | Admitting: Sports Medicine

## 2015-11-30 ENCOUNTER — Ambulatory Visit (INDEPENDENT_AMBULATORY_CARE_PROVIDER_SITE_OTHER): Payer: BLUE CROSS/BLUE SHIELD | Admitting: Sports Medicine

## 2015-11-30 DIAGNOSIS — M170 Bilateral primary osteoarthritis of knee: Secondary | ICD-10-CM

## 2015-11-30 MED ORDER — SODIUM HYALURONATE (VISCOSUP) 20 MG/2ML IX SOSY: PREFILLED_SYRINGE | INTRA_ARTICULAR | 0 refills | Status: DC

## 2015-11-30 NOTE — Assessment & Plan Note (Signed)
Custom orthotics as above. Good initial response to bilateral steroid injection with 80% recurrence of pain. Awaiting news on Visco supplementation.

## 2015-11-30 NOTE — Telephone Encounter (Signed)
Rx needed to be [facility] per [facility] information. Resent Rx.

## 2015-11-30 NOTE — Progress Notes (Signed)

## 2016-01-28 NOTE — Telephone Encounter (Signed)
Called [facility] [phone]) to get update on Euflexxa. Rx still pending pending review. Pharmacist placed STAT request on this order.  Left Pt VM with current status info.

## 2016-01-30 ENCOUNTER — Ambulatory Visit (INDEPENDENT_AMBULATORY_CARE_PROVIDER_SITE_OTHER): Payer: BLUE CROSS/BLUE SHIELD

## 2016-01-30 ENCOUNTER — Encounter: Payer: Self-pay | Admitting: Sports Medicine

## 2016-01-30 ENCOUNTER — Ambulatory Visit (INDEPENDENT_AMBULATORY_CARE_PROVIDER_SITE_OTHER): Payer: BLUE CROSS/BLUE SHIELD | Admitting: Sports Medicine

## 2016-01-30 DIAGNOSIS — M79672 Pain in left foot: Secondary | ICD-10-CM | POA: Diagnosis not present

## 2016-01-30 DIAGNOSIS — M5412 Radiculopathy, cervical region: Secondary | ICD-10-CM

## 2016-01-30 DIAGNOSIS — M4302 Spondylolysis, cervical region: Secondary | ICD-10-CM | POA: Diagnosis not present

## 2016-01-30 DIAGNOSIS — M79671 Pain in right foot: Secondary | ICD-10-CM

## 2016-01-30 DIAGNOSIS — M5030 Other cervical disc degeneration, unspecified cervical region: Secondary | ICD-10-CM | POA: Insufficient documentation

## 2016-01-30 DIAGNOSIS — M170 Bilateral primary osteoarthritis of knee: Secondary | ICD-10-CM

## 2016-01-30 MED ORDER — PREDNISONE 50 MG PO TABS: ORAL_TABLET | ORAL | 0 refills | Status: DC

## 2016-01-30 MED ORDER — CYCLOBENZAPRINE HCL 10 MG PO TABS: ORAL_TABLET | ORAL | 0 refills | Status: DC

## 2016-01-30 NOTE — Assessment & Plan Note (Signed)
Continue custom orthotics, awaiting news and viscous supplementation. Good response to steroid injection 2 months ago

## 2016-01-30 NOTE — Assessment & Plan Note (Signed)
Left C6 distribution. Prednisone, Flexeril at bedtime, continue meloxicam, x-rays, formal physical therapy. Return in one month, MRI for interventional planning if no better.

## 2016-01-30 NOTE — Assessment & Plan Note (Signed)
Bilateral x-rays. Continue orthotics for now. At some point think we will need to get some arch straps from podiatry downstairs.

## 2016-01-30 NOTE — Progress Notes (Signed)
   Subjective:    I'm seeing this patient as a consultation for:  Dr. Sunnie NielsenNatalie Alexander  CC: Neck and arm pain  HPI: This is a pleasant 45 year old male, he has known knee osteoarthritis, we are still awaiting news on Euflexxa.  Unfortunately for the past several months he said increasing pain in his neck with radiation down to the first and second fingers, moderate, persistent. No weakness, no trauma.  Also has pain that he localizes in both feet the arches between the plantar fascia and the metatarsal heads. Moderate, persistent, worse with weightbearing, custom orthotics have not been effective for this.  Past medical history:  Negative.  See flowsheet/record as well for more information.  Surgical history: Negative.  See flowsheet/record as well for more information.  Family history: Negative.  See flowsheet/record as well for more information.  Social history: Negative.  See flowsheet/record as well for more information.  Allergies, and medications have been entered into the medical record, reviewed, and no changes needed.   Review of Systems: No headache, visual changes, nausea, vomiting, diarrhea, constipation, dizziness, abdominal pain, skin rash, fevers, chills, night sweats, weight loss, swollen lymph nodes, body aches, joint swelling, muscle aches, chest pain, shortness of breath, mood changes, visual or auditory hallucinations.   Objective:   General: Well Developed, well nourished, and in no acute distress.  Neuro/Psych: Alert and oriented x3, extra-ocular muscles intact, able to move all 4 extremities, sensation grossly intact. Skin: Warm and dry, no rashes noted.  Respiratory: Not using accessory muscles, speaking in full sentences, trachea midline.  Cardiovascular: Pulses palpable, no extremity edema. Abdomen: Does not appear distended. Neck: Negative spurling's Full neck range of motion Grip strength and sensation normal in bilateral hands Strength good C4 to T1  distribution No sensory change to C4 to T1 Reflexes normal Bilateral feet: No visible erythema or swelling. Range of motion is full in all directions. Strength is 5/5 in all directions. No hallux valgus. No pes cavus or pes planus. No abnormal callus noted. No pain over the navicular prominence, or base of fifth metatarsal. No tenderness to palpation of the calcaneal insertion of plantar fascia. No pain at the Achilles insertion. No pain over the calcaneal bursa. No pain of the retrocalcaneal bursa. No tenderness to palpation over the tarsals, metatarsals, or phalanges. No hallux rigidus or limitus. No tenderness palpation over interphalangeal joints. No pain with compression of the metatarsal heads. Neurovascularly intact distally.  Impression and Recommendations:   This case required medical decision making of moderate complexity.  Primary osteoarthritis of both knees Continue custom orthotics, awaiting news and viscous supplementation. Good response to steroid injection 2 months ago  Left cervical radiculopathy Left C6 distribution. Prednisone, Flexeril at bedtime, continue meloxicam, x-rays, formal physical therapy. Return in one month, MRI for interventional planning if no better.  Bilateral foot pain Bilateral x-rays. Continue orthotics for now. At some point think we will need to get some arch straps from podiatry downstairs.

## 2016-02-06 ENCOUNTER — Ambulatory Visit: Payer: BLUE CROSS/BLUE SHIELD | Admitting: Rehabilitative and Restorative Service Providers"

## 2016-02-27 ENCOUNTER — Encounter: Payer: Self-pay | Admitting: Sports Medicine

## 2016-02-27 ENCOUNTER — Ambulatory Visit (INDEPENDENT_AMBULATORY_CARE_PROVIDER_SITE_OTHER): Payer: BLUE CROSS/BLUE SHIELD | Admitting: Sports Medicine

## 2016-02-27 DIAGNOSIS — M79671 Pain in right foot: Secondary | ICD-10-CM

## 2016-02-27 DIAGNOSIS — M5412 Radiculopathy, cervical region: Secondary | ICD-10-CM

## 2016-02-27 DIAGNOSIS — M79672 Pain in left foot: Secondary | ICD-10-CM

## 2016-02-27 DIAGNOSIS — M170 Bilateral primary osteoarthritis of knee: Secondary | ICD-10-CM | POA: Diagnosis not present

## 2016-02-27 MED ORDER — GABAPENTIN 300 MG PO CAPS: ORAL_CAPSULE | ORAL | 3 refills | Status: DC

## 2016-02-27 NOTE — Progress Notes (Signed)
  Subjective:    CC: Follow-up  HPI: Left cervical radiculopathy: Persistent despite prednisone, NSAIDs, muscle relaxers, therapy.  Bilateral foot pain: Persistent despite orthotics. X-rays were negative.  Bilateral knee osteoarthritis: Still awaiting Euflexxa shipment.  Past medical history:  Negative.  See flowsheet/record as well for more information.  Surgical history: Negative.  See flowsheet/record as well for more information.  Family history: Negative.  See flowsheet/record as well for more information.  Social history: Negative.  See flowsheet/record as well for more information.  Allergies, and medications have been entered into the medical record, reviewed, and no changes needed.   Review of Systems: No fevers, chills, night sweats, weight loss, chest pain, or shortness of breath.   Objective:    General: Well Developed, well nourished, and in no acute distress.  Neuro: Alert and oriented x3, extra-ocular muscles intact, sensation grossly intact.  HEENT: Normocephalic, atraumatic, pupils equal round reactive to light, neck supple, no masses, no lymphadenopathy, thyroid nonpalpable.  Skin: Warm and dry, no rashes. Cardiac: Regular rate and rhythm, no murmurs rubs or gallops, no lower extremity edema.  Respiratory: Clear to auscultation bilaterally. Not using accessory muscles, speaking in full sentences.  Impression and Recommendations:    Bilateral foot pain Persistent arch type pain despite custom orthotics, NSAIDs. Referral down stairs to podiatry for further advice and evaluation.  Primary osteoarthritis of both knees Euflexxa is on its way, we will start the series when the syringes arrived.  Left cervical radiculopathy Persistent left C6 and C7 distribution, adding gabapentin, it's time for an MRI and interventional planning. Return to go over MRI results.

## 2016-02-27 NOTE — Assessment & Plan Note (Signed)
Euflexxa is on its way, we will start the series when the syringes arrived.

## 2016-02-27 NOTE — Assessment & Plan Note (Signed)
Persistent left C6 and C7 distribution, adding gabapentin, it's time for an MRI and interventional planning. Return to go over MRI results.

## 2016-02-27 NOTE — Assessment & Plan Note (Signed)
Persistent arch type pain despite custom orthotics, NSAIDs. Referral down stairs to podiatry for further advice and evaluation.

## 2016-03-03 ENCOUNTER — Ambulatory Visit (INDEPENDENT_AMBULATORY_CARE_PROVIDER_SITE_OTHER): Payer: Commercial Managed Care - PPO

## 2016-03-03 DIAGNOSIS — M5412 Radiculopathy, cervical region: Secondary | ICD-10-CM

## 2016-03-03 DIAGNOSIS — M4802 Spinal stenosis, cervical region: Secondary | ICD-10-CM

## 2016-03-03 DIAGNOSIS — M50122 Cervical disc disorder at C5-C6 level with radiculopathy: Secondary | ICD-10-CM

## 2016-03-03 DIAGNOSIS — M2578 Osteophyte, vertebrae: Secondary | ICD-10-CM | POA: Diagnosis not present

## 2016-04-11 ENCOUNTER — Ambulatory Visit: Payer: Commercial Managed Care - PPO | Admitting: Sports Medicine

## 2016-04-15 ENCOUNTER — Ambulatory Visit (INDEPENDENT_AMBULATORY_CARE_PROVIDER_SITE_OTHER): Payer: Commercial Managed Care - PPO | Admitting: Sports Medicine

## 2016-04-15 ENCOUNTER — Encounter: Payer: Self-pay | Admitting: Sports Medicine

## 2016-04-15 DIAGNOSIS — M5412 Radiculopathy, cervical region: Secondary | ICD-10-CM | POA: Diagnosis not present

## 2016-04-15 DIAGNOSIS — M170 Bilateral primary osteoarthritis of knee: Secondary | ICD-10-CM | POA: Diagnosis not present

## 2016-04-15 DIAGNOSIS — M1812 Unilateral primary osteoarthritis of first carpometacarpal joint, left hand: Secondary | ICD-10-CM | POA: Diagnosis not present

## 2016-04-15 MED ORDER — DICLOFENAC SODIUM 75 MG PO TBEC: 75.0000 mg | DELAYED_RELEASE_TABLET | Freq: Two times a day (BID) | ORAL | 3 refills | Status: DC

## 2016-04-15 NOTE — Assessment & Plan Note (Addendum)
MRI confirms a soft disc protrusion into the left neural foramen at C5-C6 with clear compression of the C6 nerve root.  We are going to proceed with a cervical epidural at the C6-C7 level with medication injected up to C5-C6 level. Gabapentin at this point has been ineffective.

## 2016-04-15 NOTE — Assessment & Plan Note (Signed)
Repeat left trapeziometacarpal joint injection. Switching to Voltaren.

## 2016-04-15 NOTE — Progress Notes (Signed)
  Subjective:    CC: Multiple issues  HPI: Left trapeziometacarpal joint arthritis, previous injection was several months ago last year, desires repeat injection today.  Bilateral knee osteoarthritis: Never really followed through with Euflexxa.  Neck pain: With cervical radiculopathy, MRI confirmed protruding discs, agreeable to proceed with epidural. Failed conservative measures included physical therapy, steroids, muscle relaxers.  Past medical history:  Negative.  See flowsheet/record as well for more information.  Surgical history: Negative.  See flowsheet/record as well for more information.  Family history: Negative.  See flowsheet/record as well for more information.  Social history: Negative.  See flowsheet/record as well for more information.  Allergies, and medications have been entered into the medical record, reviewed, and no changes needed.   Review of Systems: No fevers, chills, night sweats, weight loss, chest pain, or shortness of breath.   Objective:    General: Well Developed, well nourished, and in no acute distress.  Neuro: Alert and oriented x3, extra-ocular muscles intact, sensation grossly intact.  HEENT: Normocephalic, atraumatic, pupils equal round reactive to light, neck supple, no masses, no lymphadenopathy, thyroid nonpalpable.  Skin: Warm and dry, no rashes. Cardiac: Regular rate and rhythm, no murmurs rubs or gallops, no lower extremity edema.  Respiratory: Clear to auscultation bilaterally. Not using accessory muscles, speaking in full sentences.  Procedure: Real-time Ultrasound Guided Injection of left trapeziometacarpal joint Device: GE Logiq E  Verbal informed consent obtained.  Time-out conducted.  Noted no overlying erythema, induration, or other signs of local infection.  Skin prepped in a sterile fashion.  Local anesthesia: Topical Ethyl chloride.  With sterile technique and under real time ultrasound guidance:   1/2 mL kenalog 40, 1/2 mL  lidocaine injected easily Completed without difficulty  Pain immediately resolved suggesting accurate placement of the medication.  Advised to call if fevers/chills, erythema, induration, drainage, or persistent bleeding.  Images permanently stored and available for review in the ultrasound unit.  Impression: Technically successful ultrasound guided injection.  Impression and Recommendations:    Left cervical radiculopathy MRI confirms a soft disc protrusion into the left neural foramen at C5-C6 with clear compression of the C6 nerve root.  We are going to proceed with a cervical epidural at the C6-C7 level with medication injected up to C5-C6 level. Gabapentin at this point has been ineffective.  Osteoarthritis of carpometacarpal joint of left thumb Repeat left trapeziometacarpal joint injection. Switching to Voltaren.  Primary osteoarthritis of both knees Still don't have Euflexxa, this needs to be resubmitted.

## 2016-04-15 NOTE — Assessment & Plan Note (Signed)
Still don't have Euflexxa, this needs to be resubmitted.

## 2016-04-17 ENCOUNTER — Telehealth: Payer: Self-pay | Admitting: Sports Medicine

## 2016-04-17 NOTE — Telephone Encounter (Signed)
-----   Message from Monica Bectonhomas J Thekkekandam, MD sent at 04/15/2016  2:42 PM EST ----- Needs to resubmit for both sides, no idea what happened. ___________________________________________ Ihor Austinhomas J. Benjamin Stainhekkekandam, M.D., ABFM., CAQSM. Primary Care and Sports Medicine [facility]  Adjunct Instructor of Family Medicine  [employer]

## 2016-04-17 NOTE — Telephone Encounter (Signed)
Submitted for approval on Orthovisc. Awaiting confirmation.  

## 2016-04-22 NOTE — Discharge Instructions (Addendum)
Post Procedure Spinal Discharge Instruction Sheet  1. You may resume a regular diet and any medications that you routinely take (including pain medications).  2. No driving day of procedure.  3. Light activity throughout the rest of the day.  Do not do any strenuous work, exercise, bending or lifting.  The day following the procedure, you can resume normal physical activity but you should refrain from exercising or physical therapy for at least three days thereafter.   Common Side Effects:   Headaches- take your usual medications as directed by your physician.  Increase your fluid intake.  Caffeinated beverages may be helpful.  Lie flat in bed until your headache resolves.   Restlessness or inability to sleep- you may have trouble sleeping for the next few days.  Ask your referring physician if you need any medication for sleep.   Facial flushing or redness- should subside within a few days.   Increased pain- a temporary increase in pain a day or two following your procedure is not unusual.  Take your pain medication as prescribed by your referring physician.   Leg cramps  Please contact our office at [phone] for the following symptoms:  Fever greater than 100 degrees.  Headaches unresolved with medication after 2-3 days.  Increased swelling, pain, or redness at injection site.  Thank you for visiting our office.  If you need to schedule or reschedule please call [phone].

## 2016-04-23 ENCOUNTER — Ambulatory Visit
Admission: RE | Admit: 2016-04-23 | Discharge: 2016-04-23 | Disposition: A | Discharge status: Home / Self Care | Payer: Commercial Managed Care - PPO | Source: Ambulatory Visit | Attending: Sports Medicine | Admitting: Sports Medicine

## 2016-04-23 MED ORDER — TRIAMCINOLONE ACETONIDE 40 MG/ML IJ SUSP (RADIOLOGY)
60.0000 mg | Freq: Once | INTRAMUSCULAR | Status: AC
  Administered 2016-04-23: 60 mg via EPIDURAL

## 2016-04-23 MED ORDER — IOPAMIDOL (ISOVUE-M 300) INJECTION 61%
1.0000 mL | Freq: Once | INTRAMUSCULAR | Status: AC | PRN
  Administered 2016-04-23: 1 mL via EPIDURAL

## 2016-06-11 NOTE — Telephone Encounter (Signed)
Received the following information from OV benefits investigation:   Lily Peer is covered. Spoke with Zigmund Daniel at [employer] will cover 70 % of the allowable amount. $20 copay for administration. The dedutible is $1500.00 and $1,140.42 OOP is $5500.00 and $1150.42 is met. Can [employer]. Call [phone]. Please submit script form to the portal if you would like [place] to trasnsfer to the pharmacy  Left VM for Pt to return clinic call regarding information and if he would like to schedule.

## 2016-06-24 NOTE — Telephone Encounter (Signed)
Pt returned clinic call, advised of estimated OOP cost. He is going to call insurance to see if once deductible is met, the coverage increases. Will contact clinic when he decides if he would like to proceed.

## 2016-08-05 ENCOUNTER — Encounter: Payer: Self-pay | Admitting: Osteopathic Medicine

## 2016-08-05 ENCOUNTER — Ambulatory Visit (INDEPENDENT_AMBULATORY_CARE_PROVIDER_SITE_OTHER): Payer: Commercial Managed Care - PPO | Admitting: Osteopathic Medicine

## 2016-08-05 ENCOUNTER — Ambulatory Visit (INDEPENDENT_AMBULATORY_CARE_PROVIDER_SITE_OTHER): Payer: Commercial Managed Care - PPO | Admitting: Sports Medicine

## 2016-08-05 ENCOUNTER — Encounter: Payer: Self-pay | Admitting: Sports Medicine

## 2016-08-05 ENCOUNTER — Encounter: Payer: Self-pay | Admitting: Internal Medicine

## 2016-08-05 VITALS — BP 118/83 | HR 74 | Ht 70.0 in | Wt 195.0 lb

## 2016-08-05 DIAGNOSIS — G4489 Other headache syndrome: Secondary | ICD-10-CM

## 2016-08-05 DIAGNOSIS — M170 Bilateral primary osteoarthritis of knee: Secondary | ICD-10-CM

## 2016-08-05 DIAGNOSIS — R1012 Left upper quadrant pain: Secondary | ICD-10-CM | POA: Diagnosis not present

## 2016-08-05 DIAGNOSIS — L308 Other specified dermatitis: Secondary | ICD-10-CM | POA: Diagnosis not present

## 2016-08-05 DIAGNOSIS — M1812 Unilateral primary osteoarthritis of first carpometacarpal joint, left hand: Secondary | ICD-10-CM

## 2016-08-05 DIAGNOSIS — M5030 Other cervical disc degeneration, unspecified cervical region: Secondary | ICD-10-CM

## 2016-08-05 DIAGNOSIS — G43909 Migraine, unspecified, not intractable, without status migrainosus: Secondary | ICD-10-CM | POA: Insufficient documentation

## 2016-08-05 DIAGNOSIS — L309 Dermatitis, unspecified: Secondary | ICD-10-CM | POA: Insufficient documentation

## 2016-08-05 MED ORDER — SUMATRIPTAN SUCCINATE 50 MG PO TABS: 50.0000 mg | ORAL_TABLET | ORAL | 0 refills | Status: DC | PRN

## 2016-08-05 MED ORDER — ACETAMINOPHEN ER 650 MG PO TBCR: 650.0000 mg | EXTENDED_RELEASE_TABLET | Freq: Three times a day (TID) | ORAL | 3 refills | Status: DC | PRN

## 2016-08-05 MED ORDER — IBUPROFEN 800 MG PO TABS: 800.0000 mg | ORAL_TABLET | Freq: Three times a day (TID) | ORAL | 2 refills | Status: DC | PRN

## 2016-08-05 MED ORDER — TRIAMCINOLONE ACETONIDE 0.1 % EX CREA: 1.0000 "application " | TOPICAL_CREAM | Freq: Two times a day (BID) | CUTANEOUS | 1 refills | Status: DC

## 2016-08-05 NOTE — Progress Notes (Signed)
HPI: Ryan Livingston is a 46 y.o. male  who [facility] today, 08/05/16,  for chief complaint of:  Chief Complaint  Patient presents with  . Rash    bilateral feet  . Abdominal Pain  . Migraine    Rash: Dorsal feet bilaterally, has tried over-the-counter hydrocortisone cream which has helped somewhat. Has had this rash to some degree or another for the past 3 years or so. Seems to get worse in the summertime. Itchy/flaky, no drainage, no growth.  Abdominal pain: Left upper quadrant, intermittent bloating and colicky type pain. History of colonoscopy, no records available, patient not sure why he had the colonoscopy done. No mention at that point for diverticular disease or other serious pathology.  Headaches: Several days out of the month ongoing for past few years, occasionally quite severe. He was given an as needed medication at some point few years ago, doesn't remember name of this medicine. No aura type symptoms,   Past medical history, surgical history, social history and family history reviewed.  Patient Active Problem List   Diagnosis Date Noted  . Left cervical radiculopathy 01/30/2016  . Bilateral foot pain 01/30/2016  . Annual physical exam 11/01/2015  . Primary osteoarthritis of both knees 10/25/2015  . Osteoarthritis of carpometacarpal joint of left thumb 10/25/2015    Current medication list and allergy/intolerance information reviewed.   Current Outpatient Prescriptions on File Prior to Visit  Medication Sig Dispense Refill  . cyclobenzaprine (FLEXERIL) 10 MG tablet One half tab PO qHS, then increase gradually to one tab TID. 30 tablet 0  . diclofenac (VOLTAREN) 75 MG EC tablet Take 1 tablet (75 mg total) by mouth 2 (two) times daily. 60 tablet 3  . gabapentin (NEURONTIN) 300 MG capsule One tab PO qHS for a week, then BID for a week, then TID. May double weekly to a max of 3,600mg /day 90 capsule 3  . Sodium Hyaluronate  (EUFLEXXA) 20 MG/2ML SOSY Inject into each knee weekly x3 weeks.  Dx Knee osteoarthritis, patient will bring syringes to me and I will inject 6 Syringe 0   No current facility-administered medications on file prior to visit.    No Known Allergies    Review of Systems:  Constitutional: No recent illness  HEENT: +headache, no vision change  Cardiac: No  chest pain, No  pressure, No palpitations  Respiratory:  No  shortness of breath. No  Cough  Gastrointestinal: +abdominal pain, no change on bowel habits, no nausea/vomiting  Musculoskeletal: No new myalgia/arthralgia  Skin: +Rash  Hem/Onc: No  easy bruising/bleeding, No  abnormal lumps/bumps  Neurologic: No  weakness, No  Dizziness  Psychiatric: No  concerns with depression, No  concerns with anxiety  Exam:  BP 118/83   Pulse 74   Ht 5\' 10"  (1.778 m)   Wt 195 lb (88.5 kg)   BMI 27.98 kg/m   Constitutional: VS see above. General Appearance: alert, well-developed, well-nourished, NAD  Eyes: Normal lids and conjunctive, non-icteric sclera  Ears, Nose, Mouth, Throat: MMM, Normal external inspection ears/nares/mouth/lips/gums.  Neck: No masses, trachea midline.   Respiratory: Normal respiratory effort. no wheeze, no rhonchi, no rales  Cardiovascular: S1/S2 normal, no murmur, no rub/gallop auscultated. RRR.   Gastrointestinal: Nontender, no masses  Musculoskeletal: Gait normal. Symmetric and independent movement of all extremities  Neurological: Normal balance/coordination. No tremor.  Skin: warm, dry, intact. Erythematous/flaking skin dorsal feet bilaterally, no distinct borders, no ulceration/drainage. Consistent with eczema  Psychiatric: Normal judgment/insight. Normal  mood and affect. Oriented x3.     ASSESSMENT/PLAN:   Other eczema - Trial high potency steroid as needed for severe symptoms, low potency steroid for maintenance - unusual place for eczema, consider derm referral, ?fungal seems unlikely given  improvement with steroids  Colicky LUQ abdominal pain - Advised dietary modifications, will place referral for gastroenterology given history of colonoscopy. Also states had EGD/dilation in the past, no alarm symptoms concerning for need for urgent imaging  Other headache syndrome - Chronic issue. Patient mentions this at end of visit, will trial Imitrex and have him follow-up for further discussion/evaluation of treatment    Patient Instructions    For feet: Trial higher potency steroid, 2 weeks use at a time as needed for severe symptoms should be sufficient to avoid whitening/thinning of the skin, can use over-the-counter hydrocortisone for maintenance therapy  For headaches: Will trial Imitrex to take as needed at onset of headache, plan to follow-up on this with further discussion in 2-4 weeks  For GI issues: Try probiotic, peppermint oil, see below for diet and we are referring to GI for further evaluation.   2017 UpToDate Characteristics and sources of common FODMAPs  Word that corresponds to letter in acronym Compounds in this category Foods that contain these compounds  F Fermentable  O Oligosaccharides Fructans, galacto-oligosaccharides Wheat, barley, rye, onion, leek, white part of spring onion, garlic, shallots, artichokes, beetroot, fennel, peas, chicory, pistachio, cashews, legumes, lentils, and chickpeas  D Disaccharides Lactose Milk, custard, ice cream, and yogurt  M Monosaccharides "Free fructose" (fructose in excess of glucose) Apples, pears, mangoes, cherries, watermelon, asparagus, sugar snap peas, honey, high-fructose corn syrup  A And  P Polyols Sorbitol, mannitol, maltitol, and xylitol Apples, pears, apricots, cherries, nectarines, peaches, plums, watermelon, mushrooms, cauliflower, artificially sweetened chewing gum and confectionery  FODMAPs: fermentable oligosaccharides, disaccharides, monosaccharides, and polyols. Adapted by permission from [employer]: [employer] of Gastroenterology. Lonell Face, Lomer MC, [place]. Short-chain carbohydrates and functional gastrointestinal disorders. Am J Gastroenterol 2013; 108:707. Copyright  2013. www.nature.com/ajg. Graphic 40981 Version 2.0      Follow-up plan: Return in about 4 weeks (around 09/02/2016) for follow-up on headaches.  Visit summary with medication list and pertinent instructions was printed for patient to review, alert [place] if any changes needed. All questions at time of visit were answered - patient instructed to contact office with any additional concerns. ER/RTC precautions were reviewed with the patient and understanding verbalized.

## 2016-08-05 NOTE — Assessment & Plan Note (Signed)
Orthovisc injection #1 of 4 into both knees, return in one week for #2. He's going to do ibuprofen 800 mg 3 times a day and arthritis strength Tylenol 3 times a day.

## 2016-08-05 NOTE — Patient Instructions (Addendum)
   For feet: Trial higher potency steroid, 2 weeks use at a time as needed for severe symptoms should be sufficient to avoid whitening/thinning of the skin, can use over-the-counter hydrocortisone for maintenance therapy  For headaches: Will trial Imitrex to take as needed at onset of headache, plan to follow-up on this with further discussion in 2-4 weeks  For GI issues: Try probiotic, peppermint oil, see below for diet and we are referring to GI for further evaluation.   2017 UpToDate Characteristics and sources of common FODMAPs  Word that corresponds to letter in acronym Compounds in this category Foods that contain these compounds  F Fermentable  O Oligosaccharides Fructans, galacto-oligosaccharides Wheat, barley, rye, onion, leek, white part of spring onion, garlic, shallots, artichokes, beetroot, fennel, peas, chicory, pistachio, cashews, legumes, lentils, and chickpeas  D Disaccharides Lactose Milk, custard, ice cream, and yogurt  M Monosaccharides "Free fructose" (fructose in excess of glucose) Apples, pears, mangoes, cherries, watermelon, asparagus, sugar snap peas, honey, high-fructose corn syrup  A And  P Polyols Sorbitol, mannitol, maltitol, and xylitol Apples, pears, apricots, cherries, nectarines, peaches, plums, watermelon, mushrooms, cauliflower, artificially sweetened chewing gum and confectionery  FODMAPs: fermentable oligosaccharides, disaccharides, monosaccharides, and polyols. Adapted by permission from [employer]: Limited Brands[employer]an Journal of Gastroenterology. Lonell FaceShepherd SJ, Lomer MC, [place]. Short-chain carbohydrates and functional gastrointestinal disorders. Am J Gastroenterol 2013; 108:707. Copyright  2013. www.nature.com/ajg. Graphic [id number] Version 2.0

## 2016-08-05 NOTE — Progress Notes (Signed)
Subjective:    CC: follow-up  HPI: This is a pleasant 46 year old male, he has multiple complaints.  Primary osteoarthritis of both knees: Only had temporary responses to steroid injections, desires to go ahead and start Orthovisc today, he did get approved several months ago.  Left trapeziometacarpal arthritis: Desires injection, previous injection was approximately 4 months ago.  Cervical degenerative disc disease: Fantastic response to an epidural back in February, desires a repeat but this time on the right.  Past medical history:  Negative.  See flowsheet/record as well for more information.  Surgical history: Negative.  See flowsheet/record as well for more information.  Family history: Negative.  See flowsheet/record as well for more information.  Social history: Negative.  See flowsheet/record as well for more information.  Allergies, and medications have been entered into the medical record, reviewed, and no changes needed.   Review of Systems: No fevers, chills, night sweats, weight loss, chest pain, or shortness of breath.   Objective:    General: Well Developed, well nourished, and in no acute distress.  Neuro: Alert and oriented x3, extra-ocular muscles intact, sensation grossly intact.  HEENT: Normocephalic, atraumatic, pupils equal round reactive to light, neck supple, no masses, no lymphadenopathy, thyroid nonpalpable.  Skin: Warm and dry, no rashes. Cardiac: Regular rate and rhythm, no murmurs rubs or gallops, no lower extremity edema.  Respiratory: Clear to auscultation bilaterally. Not using accessory muscles, speaking in full sentences.  Procedure: Real-time Ultrasound Guided Injection of left knee Device: GE Logiq E  Verbal informed consent obtained.  Time-out conducted.  Noted no overlying erythema, induration, or other signs of local infection.  Skin prepped in a sterile fashion.  Local anesthesia: Topical Ethyl chloride.  With sterile technique and under  real time ultrasound guidance:   30 mg/2 mL of OrthoVisc (sodium hyaluronate) in a prefilled syringe was injected easily into the knee through a 22-gauge needle. Completed without difficulty  Pain immediately resolved suggesting accurate placement of the medication.  Advised to call if fevers/chills, erythema, induration, drainage, or persistent bleeding.  Images permanently stored and available for review in the ultrasound unit.  Impression: Technically successful ultrasound guided injection.  Procedure: Real-time Ultrasound Guided Injection of right knee Device: GE Logiq E  Verbal informed consent obtained.  Time-out conducted.  Noted no overlying erythema, induration, or other signs of local infection.  Skin prepped in a sterile fashion.  Local anesthesia: Topical Ethyl chloride.  With sterile technique and under real time ultrasound guidance:   30 mg/2 mL of OrthoVisc (sodium hyaluronate) in a prefilled syringe was injected easily into the knee through a 22-gauge needle. Completed without difficulty  Pain immediately resolved suggesting accurate placement of the medication.  Advised to call if fevers/chills, erythema, induration, drainage, or persistent bleeding.  Images permanently stored and available for review in the ultrasound unit.  Impression: Technically successful ultrasound guided injection.  Procedure: Real-time Ultrasound Guided Injection of left trapeziometacarpal joint Device: GE Logiq E  Verbal informed consent obtained.  Time-out conducted.  Noted no overlying erythema, induration, or other signs of local infection.  Skin prepped in a sterile fashion.  Local anesthesia: Topical Ethyl chloride.  With sterile technique and under real time ultrasound guidance:  1/2 mL Kenalog 40, 1/2 mL lidocaine injected easily Completed without difficulty  Pain immediately resolved suggesting accurate placement of the medication.  Advised to call if fevers/chills, erythema,  induration, drainage, or persistent bleeding.  Images permanently stored and available for review in the ultrasound unit.  Impression:  Technically successful ultrasound guided injection.  Impression and Recommendations:    Primary osteoarthritis of both knees Orthovisc injection #1 of 4 into both knees, return in one week for #2. He's going to do ibuprofen 800 mg 3 times a day and arthritis strength Tylenol 3 times a day.  Osteoarthritis of carpometacarpal joint of left thumb Left trapeziometacarpal joint injection as above.  DDD (degenerative disc disease), cervical Good initial response to a left C7-T1 interlaminar epidural, repeating epidural this time on the right.

## 2016-08-05 NOTE — Assessment & Plan Note (Signed)
Left trapeziometacarpal joint injection as above.

## 2016-08-05 NOTE — Assessment & Plan Note (Signed)
Good initial response to a left C7-T1 interlaminar epidural, repeating epidural this time on the right.

## 2016-08-12 ENCOUNTER — Ambulatory Visit (INDEPENDENT_AMBULATORY_CARE_PROVIDER_SITE_OTHER): Payer: Commercial Managed Care - PPO | Admitting: Sports Medicine

## 2016-08-12 ENCOUNTER — Encounter: Payer: Self-pay | Admitting: Sports Medicine

## 2016-08-12 DIAGNOSIS — M170 Bilateral primary osteoarthritis of knee: Secondary | ICD-10-CM | POA: Diagnosis not present

## 2016-08-12 NOTE — Progress Notes (Signed)
   Procedure: Real-time Ultrasound Guided Injection of left knee Device: GE Logiq E  Verbal informed consent obtained.  Time-out conducted.  Noted no overlying erythema, induration, or other signs of local infection.  Skin prepped in a sterile fashion.  Local anesthesia: Topical Ethyl chloride.  With sterile technique and under real time ultrasound guidance:   30 mg/2 mL of OrthoVisc (sodium hyaluronate) in a prefilled syringe was injected easily into the knee through a 22-gauge needle. Completed without difficulty  Pain immediately resolved suggesting accurate placement of the medication.  Advised to call if fevers/chills, erythema, induration, drainage, or persistent bleeding.  Images permanently stored and available for review in the ultrasound unit.  Impression: Technically successful ultrasound guided injection.  Procedure: Real-time Ultrasound Guided Injection of right knee Device: GE Logiq E  Verbal informed consent obtained.  Time-out conducted.  Noted no overlying erythema, induration, or other signs of local infection.  Skin prepped in a sterile fashion.  Local anesthesia: Topical Ethyl chloride.  With sterile technique and under real time ultrasound guidance:   30 mg/2 mL of OrthoVisc (sodium hyaluronate) in a prefilled syringe was injected easily into the knee through a 22-gauge needle. Completed without difficulty  Pain immediately resolved suggesting accurate placement of the medication.  Advised to call if fevers/chills, erythema, induration, drainage, or persistent bleeding.  Images permanently stored and available for review in the ultrasound unit.  Impression: Technically successful ultrasound guided injection.   Impression and Recommendations:    Primary osteoarthritis of both knees  Orthovisc injection #2 of 4 into both knees, return in one week for #3 of 4.

## 2016-08-12 NOTE — Assessment & Plan Note (Signed)
Orthovisc injection #2 of 4 into both knees, return in one week for #3 of 4. 

## 2016-08-19 ENCOUNTER — Ambulatory Visit (INDEPENDENT_AMBULATORY_CARE_PROVIDER_SITE_OTHER): Payer: Commercial Managed Care - PPO | Admitting: Sports Medicine

## 2016-08-19 DIAGNOSIS — M170 Bilateral primary osteoarthritis of knee: Secondary | ICD-10-CM | POA: Diagnosis not present

## 2016-08-19 NOTE — Assessment & Plan Note (Signed)
Orthovisc injection #3 of 4 into both knees. Return in 1 week for number 4 of 4.

## 2016-08-19 NOTE — Progress Notes (Signed)
   Procedure: Real-time Ultrasound Guided Injection of left knee Device: GE Logiq E  Verbal informed consent obtained.  Time-out conducted.  Noted no overlying erythema, induration, or other signs of local infection.  Skin prepped in a sterile fashion.  Local anesthesia: Topical Ethyl chloride.  With sterile technique and under real time ultrasound guidance:   30 mg/2 mL of OrthoVisc (sodium hyaluronate) in a prefilled syringe was injected easily into the knee through a 22-gauge needle. Completed without difficulty  Pain immediately resolved suggesting accurate placement of the medication.  Advised to call if fevers/chills, erythema, induration, drainage, or persistent bleeding.  Images permanently stored and available for review in the ultrasound unit.  Impression: Technically successful ultrasound guided injection.  Procedure: Real-time Ultrasound Guided Injection of right knee Device: GE Logiq E  Verbal informed consent obtained.  Time-out conducted.  Noted no overlying erythema, induration, or other signs of local infection.  Skin prepped in a sterile fashion.  Local anesthesia: Topical Ethyl chloride.  With sterile technique and under real time ultrasound guidance:   30 mg/2 mL of OrthoVisc (sodium hyaluronate) in a prefilled syringe was injected easily into the knee through a 22-gauge needle. Completed without difficulty  Pain immediately resolved suggesting accurate placement of the medication.  Advised to call if fevers/chills, erythema, induration, drainage, or persistent bleeding.  Images permanently stored and available for review in the ultrasound unit.  Impression: Technically successful ultrasound guided injection.   Impression and Recommendations:    Primary osteoarthritis of both knees Orthovisc injection #3 of 4 into both knees. Return in 1 week for number 4 of 4.

## 2016-08-26 ENCOUNTER — Encounter: Payer: Self-pay | Admitting: Sports Medicine

## 2016-08-26 ENCOUNTER — Ambulatory Visit (INDEPENDENT_AMBULATORY_CARE_PROVIDER_SITE_OTHER): Payer: Commercial Managed Care - PPO | Admitting: Sports Medicine

## 2016-08-26 DIAGNOSIS — M170 Bilateral primary osteoarthritis of knee: Secondary | ICD-10-CM | POA: Diagnosis not present

## 2016-08-26 NOTE — Assessment & Plan Note (Signed)
Orthovisc No. 4 of 4, still with some pain.  Adding formal physical therapy. If insufficient relief in one month with PT and all of the Orthovisc injections he will need arthroscopy.

## 2016-08-26 NOTE — Progress Notes (Signed)
   Procedure: Real-time Ultrasound Guided Injection of left knee Device: GE Logiq E  Verbal informed consent obtained.  Time-out conducted.  Noted no overlying erythema, induration, or other signs of local infection.  Skin prepped in a sterile fashion.  Local anesthesia: Topical Ethyl chloride.  With sterile technique and under real time ultrasound guidance:   30 mg/2 mL of OrthoVisc (sodium hyaluronate) in a prefilled syringe was injected easily into the knee through a 22-gauge needle. Completed without difficulty  Pain immediately resolved suggesting accurate placement of the medication.  Advised to call if fevers/chills, erythema, induration, drainage, or persistent bleeding.  Images permanently stored and available for review in the ultrasound unit.  Impression: Technically successful ultrasound guided injection.  Procedure: Real-time Ultrasound Guided Injection of right knee Device: GE Logiq E  Verbal informed consent obtained.  Time-out conducted.  Noted no overlying erythema, induration, or other signs of local infection.  Skin prepped in a sterile fashion.  Local anesthesia: Topical Ethyl chloride.  With sterile technique and under real time ultrasound guidance:   30 mg/2 mL of OrthoVisc (sodium hyaluronate) in a prefilled syringe was injected easily into the knee through a 22-gauge needle. Completed without difficulty  Pain immediately resolved suggesting accurate placement of the medication.  Advised to call if fevers/chills, erythema, induration, drainage, or persistent bleeding.  Images permanently stored and available for review in the ultrasound unit.  Impression: Technically successful ultrasound guided injection.  Impression and Recommendations:    Primary osteoarthritis of both knees Orthovisc No. 4 of 4, still with some pain.  Adding formal physical therapy. If insufficient relief in one month with PT and all of the Orthovisc injections he will need  arthroscopy.

## 2016-09-02 ENCOUNTER — Ambulatory Visit (INDEPENDENT_AMBULATORY_CARE_PROVIDER_SITE_OTHER): Payer: Commercial Managed Care - PPO | Admitting: Osteopathic Medicine

## 2016-09-02 ENCOUNTER — Encounter: Payer: Self-pay | Admitting: Osteopathic Medicine

## 2016-09-02 VITALS — BP 125/78 | HR 73 | Wt 194.0 lb

## 2016-09-02 DIAGNOSIS — M170 Bilateral primary osteoarthritis of knee: Secondary | ICD-10-CM | POA: Diagnosis not present

## 2016-09-02 DIAGNOSIS — M1812 Unilateral primary osteoarthritis of first carpometacarpal joint, left hand: Secondary | ICD-10-CM | POA: Diagnosis not present

## 2016-09-02 DIAGNOSIS — G43101 Migraine with aura, not intractable, with status migrainosus: Secondary | ICD-10-CM

## 2016-09-02 MED ORDER — TOPIRAMATE 50 MG PO TABS: ORAL_TABLET | ORAL | 1 refills | Status: DC

## 2016-09-02 MED ORDER — CELECOXIB 100 MG PO CAPS: 100.0000 mg | ORAL_CAPSULE | Freq: Two times a day (BID) | ORAL | 3 refills | Status: DC

## 2016-09-02 NOTE — Progress Notes (Signed)
HPI: Ryan Livingston is a 46 y.o. male  who presents to [facility] today, 09/02/16,  for chief complaint of:  Chief Complaint  Patient presents with  . Hospitalization Follow-up    HEADACHES     . Context: Longstanding headache hx w/o much change lately, had a severe headache last week and went to ER, see records reviewed below.  . Location: typically unilateral, can be either side though . Quality: throbbing pain, soreness . Duration: lasts up to a few days at a time . Timing: intermittent but several times per month at least x20 years . Modifying factors: Almost daily NSAID use d/t arthritis, imitrex and fioricet not typically helpful, has never been on migraine ppx  . Assoc signs/symptoms: light sensitivity, nausea, vomiting when h/a severe   ER records reviewed: Patient was seen in ER 08/27/2016, 6 days ago. At that time, patient reported history of recurrent headaches over past 20 years, longest he has been pain-free was 3 weeks. Reports associated n/v, light sensitive. CT angiogram was performed in the ER, results indicated no acute intracranial arterial abnormality, no evidence of aneurysm or vascular malformation. Fioricet Rx was provided.     Past medical, surgical, social and family history reviewed: Patient Active Problem List   Diagnosis Date Noted  . Colicky LUQ abdominal pain 08/05/2016  . Other headache syndrome 08/05/2016  . Eczema 08/05/2016  . DDD (degenerative disc disease), cervical 01/30/2016  . Bilateral foot pain 01/30/2016  . Annual physical exam 11/01/2015  . Primary osteoarthritis of both knees 10/25/2015  . Osteoarthritis of carpometacarpal joint of left thumb 10/25/2015   Past Surgical History:  Procedure Laterality Date  . CHOLECYSTECTOMY    . LASIK    . TONSILECTOMY/ADENOIDECTOMY WITH MYRINGOTOMY     Social History  Substance Use Topics  . Smoking status: Former Games developer  . Smokeless tobacco: Never Used  .  Alcohol use No   Family History  Problem Relation Age of Onset  . Diabetes Maternal Grandmother   . Stroke Maternal Grandfather      Current medication list and allergy/intolerance information reviewed:   Current Outpatient Prescriptions  Medication Sig Dispense Refill  . acetaminophen (TYLENOL) 650 MG CR tablet Take 1 tablet (650 mg total) by mouth every 8 (eight) hours as needed for pain. 90 tablet 3  . ibuprofen (ADVIL,MOTRIN) 800 MG tablet Take 1 tablet (800 mg total) by mouth every 8 (eight) hours as needed. 60 tablet 2  . SUMAtriptan (IMITREX) 50 MG tablet Take 1 tablet (50 mg total) by mouth every 2 (two) hours as needed for migraine. May repeat in 2 hours if headache persists or recurs. Max use 150 mg per day once per week 30 tablet 0  . triamcinolone cream (KENALOG) 0.1 % Apply 1 application topically 2 (two) times daily. To affected area(s) as needed for max use 2 weeks 45 g 1   No current facility-administered medications for this visit.    No Known Allergies    Review of Systems:  Constitutional:  No  fever, no chills, No recent illness  HEENT: +headache, no vision change, no hearing change, No sore throat, No  sinus pressure  Cardiac: No  chest pain, No  pressure, No palpitations  Respiratory:  No  shortness of breath. No  Cough  Gastrointestinal: No  abdominal pain, +nausea, No  vomiting  Musculoskeletal: No new myalgia/arthralgia  Skin: No  Rash  Neurologic: No  weakness, No  dizziness, No  slurred speech/focal weakness/facial  droop   Exam:  BP 125/78   Pulse 73   Wt 194 lb (88 kg)   BMI 27.84 kg/m   Constitutional: VS see above. General Appearance: alert, well-developed, well-nourished, NAD  Eyes: Normal lids and conjunctive, non-icteric sclera  Ears, Nose, Mouth, Throat: MMM, Normal external inspection ears/nares/mouth/lips/gums.   Neck: No masses, trachea midline. No thyroid enlargement.   Respiratory: Normal respiratory effort. no wheeze, no  rhonchi, no rales  Cardiovascular: S1/S2 normal, no murmur, no rub/gallop auscultated. RRR. No lower extremity edema.  Musculoskeletal: Gait normal. No clubbing/cyanosis of digits.   Neurological: Normal balance/coordination. No tremor. No cranial nerve deficit on limited exam. Motor and sensation intact and symmetric. Cerebellar reflexes intact. EOMI, PERRLA, no nystagmus but light causes pain   Skin: warm, dry, intact.     Psychiatric: Normal judgment/insight. Normal mood and affect. Oriented x3.     ASSESSMENT/PLAN:   Migraine with aura and with status migrainosus, not intractable - Will start preventive meds - surprising that this hasn't been tried, advised frequent use analgesic may be causing withdrawal headaches - Plan: butalbital-acetaminophen-caffeine (FIORICET, ESGIC) 50-325-40 MG tablet, topiramate (TOPAMAX) 50 MG tablet  Primary osteoarthritis of both knees - Plan: celecoxib (CELEBREX) 100 MG capsule  Primary osteoarthritis of first carpometacarpal joint of left hand - Plan: celecoxib (CELEBREX) 100 MG capsule    Patient Instructions  Plan:  We are starting a medication for you to take in the evenings to help prevent headaches. We are starting at a low dose and increasing gradually. Let you know if you have any concerning side effects with this medicine, most people may feel some sedation effects but that tends to get better. Can still use Fioricet or Imitrex as needed for severe headache. Left plan to follow-up in the office in 3-4 weeks, sooner if needed  I also sent in an alternative anti-inflammatory for you, would recommend follow-up as directed by Dr. Karie Schwalbe for discussion of alternative therapies if this isn't covered or isn't working    Visit summary with medication list and pertinent instructions was printed for patient to review. All questions at time of visit were answered - patient instructed to contact office with any additional concerns. ER/RTC precautions were  reviewed with the patient. Follow-up plan: Return in about 3 weeks (around 09/23/2016) for recheck migraine on preventive medications .

## 2016-09-02 NOTE — Patient Instructions (Signed)
Plan:  We are starting a medication for you to take in the evenings to help prevent headaches. We are starting at a low dose and increasing gradually. Let you know if you have any concerning side effects with this medicine, most people may feel some sedation effects but that tends to get better. Can still use Fioricet or Imitrex as needed for severe headache. Left plan to follow-up in the office in 3-4 weeks, sooner if needed  I also sent in an alternative anti-inflammatory for you, would recommend follow-up as directed by Dr. Karie Schwalbe for discussion of alternative therapies if this isn't covered or isn't working

## 2016-09-09 ENCOUNTER — Ambulatory Visit: Payer: Commercial Managed Care - PPO | Admitting: Internal Medicine

## 2016-09-30 ENCOUNTER — Ambulatory Visit: Payer: Commercial Managed Care - PPO | Admitting: Sports Medicine

## 2016-09-30 DIAGNOSIS — Z0189 Encounter for other specified special examinations: Secondary | ICD-10-CM

## 2016-12-21 ENCOUNTER — Emergency Department (INDEPENDENT_AMBULATORY_CARE_PROVIDER_SITE_OTHER)
Admission: EM | Admit: 2016-12-21 | Discharge: 2016-12-21 | Disposition: A | Discharge status: Home / Self Care | Payer: Commercial Managed Care - PPO | Source: Home / Self Care

## 2016-12-21 ENCOUNTER — Encounter: Payer: Self-pay | Admitting: Emergency Medicine

## 2016-12-21 DIAGNOSIS — J0120 Acute ethmoidal sinusitis, unspecified: Secondary | ICD-10-CM

## 2016-12-21 MED ORDER — AMOXICILLIN-POT CLAVULANATE 500-125 MG PO TABS: 1.0000 | ORAL_TABLET | Freq: Three times a day (TID) | ORAL | 0 refills | Status: DC

## 2016-12-21 NOTE — ED Triage Notes (Signed)
Sinus Pressure and Cough x 1 week

## 2016-12-22 NOTE — ED Provider Notes (Signed)
[facility]    CSN: [id number] Arrival date & time: 12/21/16  1432     History   Chief Complaint Chief Complaint  Patient presents with  . Cough    HPI Ryan Livingston is a 46 y.o. male.   The history is provided by the patient. No language interpreter was used.  Cough  Cough characteristics:  Productive Sputum characteristics:  Manson Passey Severity:  Severe Onset quality:  Gradual Duration:  1 week Timing:  Constant Progression:  Worsening Chronicity:  New Smoker: no   Context: not smoke exposure   Relieved by:  Nothing Worsened by:  Nothing Ineffective treatments:  None tried Associated symptoms: shortness of breath   Risk factors: recent infection   Pt reports he has a sinus infection.  Pt reports he has had multiple in the past.    Past Medical History:  Diagnosis Date  . DDD (degenerative disc disease), cervical   . Eczema   . Heart murmur   . Migraine   . Nonrheumatic aortic valve insufficiency   . OA (osteoarthritis) of knee    both knees    Patient Active Problem List   Diagnosis Date Noted  . Colicky LUQ abdominal pain 08/05/2016  . Other headache syndrome 08/05/2016  . Eczema 08/05/2016  . DDD (degenerative disc disease), cervical 01/30/2016  . Bilateral foot pain 01/30/2016  . Annual physical exam 11/01/2015  . Primary osteoarthritis of both knees 10/25/2015  . Osteoarthritis of carpometacarpal joint of left thumb 10/25/2015    Past Surgical History:  Procedure Laterality Date  . CHOLECYSTECTOMY    . LASIK    . TONSILECTOMY/ADENOIDECTOMY WITH MYRINGOTOMY         Home Medications    Prior to Admission medications   Medication Sig Start Date End Date Taking? Authorizing Provider  amoxicillin-clavulanate (AUGMENTIN) 500-125 MG tablet Take 1 tablet (500 mg total) by mouth every 8 (eight) hours. 12/21/16   Elson Areas, PA-C    Family History Family History  Problem Relation Age of Onset  . Diabetes Maternal Grandmother     . Stroke Maternal Grandfather     Social History Social History  Substance Use Topics  . Smoking status: Former Games developer  . Smokeless tobacco: Never Used  . Alcohol use No     Allergies   Patient has no known allergies.   Review of Systems Review of Systems  Respiratory: Positive for cough and shortness of breath.   All other systems reviewed and are negative.    Physical Exam Triage Vital Signs ED Triage Vitals  Enc Vitals Group     BP 12/21/16 1504 128/83     Pulse Rate 12/21/16 1504 79     Resp 12/21/16 1504 16     Temp 12/21/16 1504 98.6 F (37 C)     Temp Source 12/21/16 1504 Oral     SpO2 12/21/16 1504 97 %     Weight 12/21/16 1505 190 lb (86.2 kg)     Height 12/21/16 1505 5\' 10"  (1.778 m)     Head Circumference --      Peak Flow --      Pain Score 12/21/16 1505 7     Pain Loc --      Pain Edu? --      Excl. in GC? --    No data found.   Updated Vital Signs BP 128/83 (BP Location: Left Arm)   Pulse 79   Temp 98.6 F (37 C) (Oral)   Resp  16   Ht 5\' 10"  (1.778 m)   Wt 190 lb (86.2 kg)   SpO2 97%   BMI 27.26 kg/m   Visual Acuity Right Eye Distance:   Left Eye Distance:   Bilateral Distance:    Right Eye Near:   Left Eye Near:    Bilateral Near:     Physical Exam  Constitutional: He appears well-developed and well-nourished.  HENT:  Head: Normocephalic and atraumatic.  Right Ear: External ear normal.  Left Ear: External ear normal.  Eyes: Conjunctivae are normal.  Tender frontal and maxillary sinuses  Neck: Neck supple.  Cardiovascular: Normal rate and regular rhythm.   No murmur heard. Pulmonary/Chest: Effort normal and breath sounds normal. No respiratory distress.  Abdominal: Soft. There is no tenderness.  Musculoskeletal: He exhibits no edema.  Neurological: He is alert.  Skin: Skin is warm and dry.  Psychiatric: He has a normal mood and affect.  Nursing note and vitals reviewed.    UC Treatments / Results  Labs (all labs  ordered are listed, but only abnormal results are displayed) Labs Reviewed - No data to display  EKG  EKG Interpretation None       Radiology No results found.  Procedures Procedures (including critical care time)  Medications Ordered in UC Medications - No data to display   Initial Impression / Assessment and Plan / UC Course  I have reviewed the triage vital signs and the nursing notes.  Pertinent labs & imaging results that were available during my care of the patient were reviewed by me and considered in my medical decision making (see chart for details).        Final Clinical Impressions(s) / UC Diagnoses   Final diagnoses:  Acute ethmoidal sinusitis, recurrence not specified    New Prescriptions Discharge Medication List as of 12/21/2016  3:12 PM    START taking these medications   Details  amoxicillin-clavulanate (AUGMENTIN) 500-125 MG tablet Take 1 tablet (500 mg total) by mouth every 8 (eight) hours., Starting Sun 12/21/2016, Print       An After Visit Summary was printed and given to the patient.   Controlled Substance Prescriptions [place] Controlled Substance Registry consulted? Not needed   Elson AreasSofia, Leslie K, [place] JerseyPA-C 12/22/16 1150

## 2017-01-19 ENCOUNTER — Encounter: Payer: Self-pay | Admitting: Physician Assistant

## 2017-01-19 ENCOUNTER — Ambulatory Visit (INDEPENDENT_AMBULATORY_CARE_PROVIDER_SITE_OTHER): Payer: Commercial Managed Care - PPO

## 2017-01-19 ENCOUNTER — Ambulatory Visit (INDEPENDENT_AMBULATORY_CARE_PROVIDER_SITE_OTHER): Payer: Commercial Managed Care - PPO | Admitting: Physician Assistant

## 2017-01-19 VITALS — BP 116/75 | HR 80 | Temp 98.6°F | Ht 70.0 in | Wt 197.0 lb

## 2017-01-19 DIAGNOSIS — R0602 Shortness of breath: Secondary | ICD-10-CM

## 2017-01-19 DIAGNOSIS — R05 Cough: Secondary | ICD-10-CM

## 2017-01-19 DIAGNOSIS — R61 Generalized hyperhidrosis: Secondary | ICD-10-CM

## 2017-01-19 DIAGNOSIS — R058 Other specified cough: Secondary | ICD-10-CM | POA: Insufficient documentation

## 2017-01-19 DIAGNOSIS — R5381 Other malaise: Secondary | ICD-10-CM

## 2017-01-19 MED ORDER — PREDNISONE 20 MG PO TABS: ORAL_TABLET | ORAL | 0 refills | Status: DC

## 2017-01-19 MED ORDER — BENZONATATE 200 MG PO CAPS: 200.0000 mg | ORAL_CAPSULE | Freq: Two times a day (BID) | ORAL | 0 refills | Status: DC | PRN

## 2017-01-19 MED ORDER — IPRATROPIUM-ALBUTEROL 0.5-2.5 (3) MG/3ML IN SOLN
3.0000 mL | Freq: Once | RESPIRATORY_TRACT | Status: AC
  Administered 2017-01-19: 3 mL via RESPIRATORY_TRACT

## 2017-01-19 NOTE — Patient Instructions (Signed)
Cough, Adult Coughing is a reflex that clears your throat and your airways. Coughing helps to heal and protect your lungs. It is normal to cough occasionally, but a cough that happens with other symptoms or lasts a long time may be a sign of a condition that needs treatment. A cough may last only 2-3 weeks (acute), or it may last longer than 8 weeks (chronic). What are the causes? Coughing is commonly caused by:  Breathing in substances that irritate your lungs.  A viral or bacterial respiratory infection.  Allergies.  Asthma.  Postnasal drip.  Smoking.  Acid backing up from the stomach into the esophagus (gastroesophageal reflux).  Certain medicines.  Chronic lung problems, including COPD (or rarely, lung cancer).  Other medical conditions such as heart failure.  Follow these instructions at home: Pay attention to any changes in your symptoms. Take these actions to help with your discomfort:  Take medicines only as told by your health care provider. ? If you were prescribed an antibiotic medicine, take it as told by your health care provider. Do not stop taking the antibiotic even if you start to feel better. ? Talk with your health care provider before you take a cough suppressant medicine.  Drink enough fluid to keep your urine clear or pale yellow.  If the air is dry, use a cold steam vaporizer or humidifier in your bedroom or your home to help loosen secretions.  Avoid anything that causes you to cough at work or at home.  If your cough is worse at night, try sleeping in a semi-upright position.  Avoid cigarette smoke. If you smoke, quit smoking. If you need help quitting, ask your health care provider.  Avoid caffeine.  Avoid alcohol.  Rest as needed.  Contact a health care provider if:  You have new symptoms.  You cough up pus.  Your cough does not get better after 2-3 weeks, or your cough gets worse.  You cannot control your cough with suppressant  medicines and you are losing sleep.  You develop pain that is getting worse or pain that is not controlled with pain medicines.  You have a fever.  You have unexplained weight loss.  You have night sweats. Get help right away if:  You cough up blood.  You have difficulty breathing.  Your heartbeat is very fast. This information is not intended to replace advice given to you by your health care provider. Make sure you discuss any questions you have with your health care provider. Document Released: 08/09/2010 Document Revised: 07/19/2015 Document Reviewed: 04/19/2014 Elsevier Interactive Patient Education  2017 Elsevier Inc.  

## 2017-01-19 NOTE — Progress Notes (Signed)
Call pt: CXR is normal. Will send over prednisone taper and tessalon pearles for cough.

## 2017-01-19 NOTE — Progress Notes (Signed)
Subjective:    Patient ID: Ryan Livingston, male    DOB: [birth year], 46 y.o.   MRN: [id number]  HPI  Patient is a 46 year old pleasant male who presents to the clinic with persistent cough for the last 3 weeks.  Patient also complains of malaise and night sweats.  He was seen at urgent care on 12/21/16 and diagnosed with ethmoid sinusitis and given Augmentin.  He does feel like his sinus pressure and upper respiratory symptoms improved.  His cough has remained.  His cough is mostly dry and persistent throughout the whole day.  He denies any past medical history of asthma.  He denies any wheezing or shortness of breath.  He has tried NyQuil, Mucinex, Rosanne AshingJim being liquor, humidifier.  He does not feel like anything is helping.  He has had an albuterol inhaler in the past but is not use during this current exacerbation.  He denies any other sick contacts at home.  He denies any recent travel.  .. Active Ambulatory Problems    Diagnosis Date Noted  . Primary osteoarthritis of both knees 10/25/2015  . Osteoarthritis of carpometacarpal joint of left thumb 10/25/2015  . Annual physical exam 11/01/2015  . DDD (degenerative disc disease), cervical 01/30/2016  . Bilateral foot pain 01/30/2016  . Colicky LUQ abdominal pain 08/05/2016  . Other headache syndrome 08/05/2016  . Eczema 08/05/2016  . Malaise 01/19/2017  . Cough present for greater than 3 weeks 01/19/2017  . Night sweats 01/19/2017   Resolved Ambulatory Problems    Diagnosis Date Noted  . Inflamed skin tag 11/01/2015   Past Medical History:  Diagnosis Date  . DDD (degenerative disc disease), cervical   . Eczema   . Heart murmur   . Migraine   . Nonrheumatic aortic valve insufficiency   . OA (osteoarthritis) of knee         Review of Systems See HPI.     Objective:   Physical Exam  Constitutional: He is oriented to person, place, and time. He appears well-developed and well-nourished.  HENT:  Head: Normocephalic and  atraumatic.  Right Ear: External ear normal.  Left Ear: External ear normal.  Nose: Nose normal.  Mouth/Throat: Oropharynx is clear and moist. No oropharyngeal exudate.  TM"s clear bilaterally.  Negative for sinus tenderness over maxillary or frontal sinuses.    Eyes: Conjunctivae are normal. Right eye exhibits no discharge. Left eye exhibits no discharge.  Neck: Normal range of motion. Neck supple.  Cardiovascular: Normal rate and regular rhythm.  2/6 SEM  Pulmonary/Chest: Effort normal and breath sounds normal. He has no wheezes. He has no rales. He exhibits no tenderness.  Lymphadenopathy:    He has no cervical adenopathy.  Neurological: He is alert and oriented to person, place, and time.  Psychiatric: He has a normal mood and affect. His behavior is normal.          Assessment & Plan:  Marland Kitchen.Marland Kitchen.Ryan Livingston was seen today for cough.  Diagnoses and all orders for this visit:  Cough present for greater than 3 weeks -     DG Chest 2 View -     ipratropium-albuterol (DUONEB) 0.5-2.5 (3) MG/3ML nebulizer solution 3 mL -     Culture, Bordetella -     Bordetella Pertussis PCR  Night sweats -     DG Chest 2 View  Malaise   Unclear etiology of cough today.  Peak flows in office were in the yellow and unchanged after DuoNeb in office;therefor does not appear  to be due reactive airway/asthma. If cough persist we could consider spirometry. Patient also reported no symptomatic improvement either.  His pulse ox today is 97% which is very encouraging.  He is afebrile.  Checks x-ray was obtained stat and reviewed and negative for any acute findings.  Patient's lung exam was normal.  I am suspicious of pertussis and tuberculosis given the night sweats and malaise.  We did do a culture for pertussis.  Since chest x-ray was normal I started patient on a taper of prednisone and gave him Tessalon Perles for cough.  Certainly this could represent a post viral cough syndrome hopefully the prednisone would  help with this. Encouraged cough drops and staying hydrated.  Discussed if patient has any worsening or not improving symptoms to follow-up in office.

## 2017-01-20 ENCOUNTER — Other Ambulatory Visit: Payer: Self-pay | Admitting: Physician Assistant

## 2017-01-20 ENCOUNTER — Telehealth: Payer: Self-pay | Admitting: Osteopathic Medicine

## 2017-01-20 LAB — TIQ-NTM

## 2017-01-20 LAB — CULTURE, BORDETELLA PERTUSSIS

## 2017-01-20 MED ORDER — AZITHROMYCIN 250 MG PO TABS
ORAL_TABLET | ORAL | 0 refills | Status: DC
Start: 2017-01-20 — End: 2017-02-19

## 2017-01-20 NOTE — Telephone Encounter (Signed)
Pt advised. He has already gotten the work note.

## 2017-01-20 NOTE — Progress Notes (Signed)
Pt has gotten worse. Awaiting pertussis culture. Will treat with zpak to cover for this and see if there is any improvement.

## 2017-01-20 NOTE — Telephone Encounter (Signed)
Sent zpak to add to medications from yesterday. Awaiting culture. Ok for work note.

## 2017-01-20 NOTE — Telephone Encounter (Signed)
Patient seen Ryan Livingston Yesterday 01-19-17 and he came by today for a work note for today because he started feeling worse yesterday. He wants Ryan Livingston to know that he started cough up stuff, sneezing and coughing and does she advise anything else for him to do? Please inform patient

## 2017-01-23 LAB — BORDETELLA PERTUSSIS PCR
B. PERTUSSIS DNA: NOT DETECTED
B. parapertussis DNA: NOT DETECTED

## 2017-02-19 ENCOUNTER — Encounter: Payer: Self-pay | Admitting: Osteopathic Medicine

## 2017-02-19 ENCOUNTER — Ambulatory Visit (INDEPENDENT_AMBULATORY_CARE_PROVIDER_SITE_OTHER): Payer: Commercial Managed Care - PPO | Admitting: Osteopathic Medicine

## 2017-02-19 VITALS — BP 119/79 | HR 76 | Temp 98.1°F | Wt 202.1 lb

## 2017-02-19 DIAGNOSIS — Z0000 Encounter for general adult medical examination without abnormal findings: Secondary | ICD-10-CM

## 2017-02-19 DIAGNOSIS — R1319 Other dysphagia: Secondary | ICD-10-CM

## 2017-02-19 LAB — COMPLETE METABOLIC PANEL WITH GFR
AG RATIO: 2 (calc) (ref 1.0–2.5)
ALBUMIN MSPROF: 4.5 g/dL (ref 3.6–5.1)
ALT: 30 U/L (ref 9–46)
AST: 19 U/L (ref 10–40)
Alkaline phosphatase (APISO): 68 U/L (ref 40–115)
BILIRUBIN TOTAL: 0.4 mg/dL (ref 0.2–1.2)
BUN: 17 mg/dL (ref 7–25)
CHLORIDE: 105 mmol/L (ref 98–110)
CO2: 28 mmol/L (ref 20–32)
Calcium: 9.7 mg/dL (ref 8.6–10.3)
Creat: 1.08 mg/dL (ref 0.60–1.35)
GFR, EST AFRICAN AMERICAN: 94 mL/min/{1.73_m2} (ref >=60)
GFR, Est Non African American: 81 mL/min/{1.73_m2} (ref >=60)
Globulin: 2.2 g/dL (calc) (ref 1.9–3.7)
Glucose, Bld: 89 mg/dL (ref 65–139)
POTASSIUM: 4.2 mmol/L (ref 3.5–5.3)
Sodium: 139 mmol/L (ref 135–146)
TOTAL PROTEIN: 6.7 g/dL (ref 6.1–8.1)

## 2017-02-19 LAB — LIPID PANEL
Cholesterol: 195 mg/dL (ref <200)
HDL: 36 mg/dL — ABNORMAL LOW (ref >40)
LDL Cholesterol (Calc): 120 mg/dL (calc) — ABNORMAL HIGH
Non-HDL Cholesterol (Calc): 159 mg/dL (calc) — ABNORMAL HIGH (ref <130)
Total CHOL/HDL Ratio: 5.4 (calc) — ABNORMAL HIGH (ref <5.0)
Triglycerides: 296 mg/dL — ABNORMAL HIGH (ref <150)

## 2017-02-19 LAB — CBC
HCT: 44.5 % (ref 38.5–50.0)
Hemoglobin: 15.6 g/dL (ref 13.2–17.1)
MCH: 31 pg (ref 27.0–33.0)
MCHC: 35.1 g/dL (ref 32.0–36.0)
MCV: 88.3 fL (ref 80.0–100.0)
MPV: 9.6 fL (ref 7.5–12.5)
PLATELETS: 274 10*3/uL (ref 140–400)
RBC: 5.04 10*6/uL (ref 4.20–5.80)
RDW: 12.3 % (ref 11.0–15.0)
WBC: 5.9 10*3/uL (ref 3.8–10.8)

## 2017-02-19 NOTE — Progress Notes (Signed)
HPI: Ryan Livingston is a 46 y.o. male  who presents to [facility] today, 02/19/17,  for chief complaint of:  Chief Complaint  Patient presents with  . Annual Exam  . Sinus Problem    Patient here for annual physical / wellness exam.  See preventive care reviewed as below.  Recent labs reviewed in detail with the patient.   Reports some sinus congestion, over-the-counter medications have been helping.  Reports some difficulty swallowing, history of esophageal dilation in the past, thinks he may be due to have this done again and requests referral     Past medical, surgical, social and family history reviewed: Past Medical History:  Diagnosis Date  . DDD (degenerative disc disease), cervical   . Eczema   . Heart murmur   . Migraine   . Nonrheumatic aortic valve insufficiency   . OA (osteoarthritis) of knee    both knees   Past Surgical History:  Procedure Laterality Date  . CHOLECYSTECTOMY    . LASIK    . TONSILECTOMY/ADENOIDECTOMY WITH MYRINGOTOMY     Social History   Tobacco Use  . Smoking status: Former Games developermoker  . Smokeless tobacco: Never Used  Substance Use Topics  . Alcohol use: No   Family History  Problem Relation Age of Onset  . Diabetes Maternal Grandmother   . Stroke Maternal Grandfather      Current medication list and allergy/intolerance information reviewed:   No current outpatient medications on file.   No current facility-administered medications for this visit.    No Known Allergies    Review of Systems:  Constitutional:  No  fever, no chills, No recent illness, No unintentional weight changes. No significant fatigue.   HEENT: headache, no vision change, no hearing change, No sore throat, No  sinus pressure  Cardiac: No  chest pain, No  pressure, No palpitations, No  Orthopnea  Respiratory: No  Cough  Gastrointestinal: No  abdominal pain, No  nausea, No  vomiting,  No  blood in stool, No  diarrhea,  No  constipation   Musculoskeletal: No new myalgia/arthralgia  Genitourinary: No  incontinence, No  abnormal genital bleeding, No abnormal genital discharge  Skin: No  Rash, No other wounds/concerning lesions  Hem/Onc: No  easy bruising/bleeding, No  abnormal lymph node  Endocrine: No cold intolerance,  No heat intolerance. No polyuria/polydipsia/polyphagia   Neurologic: No  weakness, No  dizziness, No  slurred speech/focal weakness/facial droop  Psychiatric: No  concerns with depression, No  concerns with anxiety, No sleep problems, No mood problems  Exam:  BP 119/79 (BP Location: Right Arm)   Pulse 76   Temp 98.1 F (36.7 C) (Oral)   Wt 202 lb 1.3 oz (91.7 kg)   BMI 29.00 kg/m   Constitutional: VS see above. General Appearance: alert, well-developed, well-nourished, NAD  Eyes: Normal lids and conjunctive, non-icteric sclera  Ears, Nose, Mouth, Throat: MMM, Normal external inspection ears/nares/mouth/lips/gums. TM normal bilaterally. Pharynx/tonsils no erythema, no exudate. Nasal mucosa normal.   Neck: No masses, trachea midline. No thyroid enlargement. No tenderness/mass appreciated. No lymphadenopathy  Respiratory: Normal respiratory effort. no wheeze, no rhonchi, no rales  Cardiovascular: S1/S2 normal, no murmur, no rub/gallop auscultated. RRR. No lower extremity edema.  Gastrointestinal: Nontender, no masses. No hepatomegaly, no splenomegaly. No hernia appreciated. Bowel sounds normal. Rectal exam deferred.   Musculoskeletal: Gait normal. No clubbing/cyanosis of digits.   Neurological: No cranial nerve deficit on limited exam. Motor and sensation intact and symmetric. Cerebellar  reflexes intact. Normal balance/coordination. No tremor.   Skin: warm, dry, intact. No rash/ulcer. No concerning nevi or subq nodules on limited exam.  Inflamed skin tag R neck, appears benign, other skin tags R and L neck appear benign  Psychiatric: Normal judgment/insight. Normal mood and  affect. Oriented x3.      ASSESSMENT/PLAN:   Annual physical exam - Plan: CBC, COMPLETE METABOLIC PANEL WITH GFR, Lipid panel  Other dysphagia - Plan: Ambulatory referral to Gastroenterology  Patient Instructions  Plan:  Fasting labs at your convenience  Form should be faxed - please contact your employer/insurer to confirm it was received   You should get a call about setting up an appointment with a GI doctor for dilation of the esophagus   If all is well, come see me in a year for routine checkup, sooner if needed!      MALE PREVENTIVE CARE updated 11/01/15  ANNUAL SCREENING/COUNSELING  Any changes to health in the past year? no  Tobacco - quit in 1989   Alcohol - none  Diet/Exercise - Healthy habits discussed to decrease CV risk and promote overall health. Patient does not have dietary restrictions.   Depression - PQH2 Negative  Feel safe at home? - yes  HTN SCREENING - SEE VITALS  SEXUAL/REPRODUCTIVE HEALTH  Sexually active? - Yes with male.  STI testing needed/desired today? - no  Any concerns with testosterone/libido? - no  INFECTIOUS DISEASE SCREENING  HIV - needs but declines  GC/CT - does not need  HepC - does not need  TB - does not need  CANCER SCREENING  Lung - does not need  Colon - does not need - had colonoscopy done, not sure reason but (+) polyps, [place] in [place], pt unclear on follow-up - will request records  Prostate - does not need  OTHER DISEASE SCREENING  Lipid - needs  DM2 - needs  Osteoporosis - does not need  ADULT VACCINATION  Influenza - does not need today, annual vaccine recommended  Td - already has - pt reports done 2017  Zoster - was not indicated  Pneumonia - was not indicated     Visit summary with medication list and pertinent instructions was printed for patient to review. All questions at time of visit were answered - patient instructed to contact office with any additional concerns.  ER/RTC precautions were reviewed with the patient.   Follow-up plan: Return for ANNUAL PHYSICAL next year, sooner if needed.

## 2017-02-19 NOTE — Patient Instructions (Addendum)
Plan:  Fasting labs at your convenience  Form should be faxed - please contact your employer/insurer to confirm it was received   You should get a call about setting up an appointment with a GI doctor for dilation of the esophagus   If all is well, come see me in a year for routine checkup, sooner if needed!

## 2017-02-26 ENCOUNTER — Encounter: Payer: Self-pay | Admitting: Osteopathic Medicine

## 2017-04-11 ENCOUNTER — Other Ambulatory Visit: Payer: Self-pay

## 2017-04-11 ENCOUNTER — Emergency Department (INDEPENDENT_AMBULATORY_CARE_PROVIDER_SITE_OTHER)
Admission: EM | Admit: 2017-04-11 | Discharge: 2017-04-11 | Disposition: A | Discharge status: Home / Self Care | Payer: Commercial Managed Care - PPO | Source: Home / Self Care | Attending: Family Medicine | Admitting: Family Medicine

## 2017-04-11 ENCOUNTER — Encounter: Payer: Self-pay | Admitting: Emergency Medicine

## 2017-04-11 DIAGNOSIS — B9789 Other viral agents as the cause of diseases classified elsewhere: Secondary | ICD-10-CM | POA: Diagnosis not present

## 2017-04-11 DIAGNOSIS — J069 Acute upper respiratory infection, unspecified: Secondary | ICD-10-CM

## 2017-04-11 MED ORDER — BENZONATATE 200 MG PO CAPS: ORAL_CAPSULE | ORAL | 0 refills | Status: DC

## 2017-04-11 MED ORDER — AZITHROMYCIN 250 MG PO TABS: ORAL_TABLET | ORAL | 0 refills | Status: DC

## 2017-04-11 NOTE — ED Provider Notes (Signed)
[facility]    CSN: [id number] Arrival date & time: 04/11/17  1136     History   Chief Complaint Chief Complaint  Patient presents with  . Facial Pain    HPI Ryan DragonBrian Sabatino is a 47 y.o. male.   Patient complains of 3 day history of sinus congestion and pressure, myalgias, and mild cough.  No sore throat.  He has had some chills but no fever.   The history is provided by the patient.    Past Medical History:  Diagnosis Date  . DDD (degenerative disc disease), cervical   . Eczema   . Heart murmur   . Migraine   . Nonrheumatic aortic valve insufficiency   . OA (osteoarthritis) of knee    both knees    Patient Active Problem List   Diagnosis Date Noted  . Malaise 01/19/2017  . Cough present for greater than 3 weeks 01/19/2017  . Night sweats 01/19/2017  . Colicky LUQ abdominal pain 08/05/2016  . Other headache syndrome 08/05/2016  . Eczema 08/05/2016  . DDD (degenerative disc disease), cervical 01/30/2016  . Bilateral foot pain 01/30/2016  . Annual physical exam 11/01/2015  . Primary osteoarthritis of both knees 10/25/2015  . Osteoarthritis of carpometacarpal joint of left thumb 10/25/2015    Past Surgical History:  Procedure Laterality Date  . CHOLECYSTECTOMY    . LASIK    . TONSILECTOMY/ADENOIDECTOMY WITH MYRINGOTOMY         Home Medications    Prior to Admission medications   Medication Sig Start Date End Date Taking? Authorizing Provider  azithromycin (ZITHROMAX Z-PAK) 250 MG tablet Take 2 tabs today; then begin one tab once daily for 4 more days. (Rx void after 04/19/17) 04/11/17   Lattie HawBeese, Alysen Smylie A, MD  benzonatate (TESSALON) 200 MG capsule Take one cap by mouth at bedtime as needed for cough.  May repeat in 4 to 6 hours 04/11/17   Lattie HawBeese, Karagan Lehr A, MD    Family History Family History  Problem Relation Age of Onset  . Diabetes Maternal Grandmother   . Stroke Maternal Grandfather     Social History Social History   Tobacco Use  .  Smoking status: Former Games developermoker  . Smokeless tobacco: Never Used  Substance Use Topics  . Alcohol use: No  . Drug use: No     Allergies   Patient has no known allergies.   Review of Systems Review of Systems No sore throat + cough No pleuritic pain No wheezing + nasal congestion + post-nasal drainage No sinus pain/pressure No itchy/red eyes No earache No hemoptysis No SOB No fever, + chills No nausea No vomiting No abdominal pain No diarrhea No urinary symptoms No skin rash + fatigue + myalgias No headache Used OTC meds without relief   Physical Exam Triage Vital Signs ED Triage Vitals  Enc Vitals Group     BP 04/11/17 1333 117/73     Pulse Rate 04/11/17 1333 89     Resp 04/11/17 1333 18     Temp 04/11/17 1333 98.4 F (36.9 C)     Temp Source 04/11/17 1333 Oral     SpO2 04/11/17 1333 96 %     Weight 04/11/17 1335 195 lb (88.5 kg)     Height 04/11/17 1335 5\' 10"  (1.778 m)     Head Circumference --      Peak Flow --      Pain Score 04/11/17 1334 4     Pain Loc --  Pain Edu? --      Excl. in GC? --    No data found.  Updated Vital Signs BP 117/73 (BP Location: Right Arm)   Pulse 89   Temp 98.4 F (36.9 C) (Oral)   Resp 18   Ht 5\' 10"  (1.778 m)   Wt 195 lb (88.5 kg)   SpO2 96%   BMI 27.98 kg/m   Visual Acuity Right Eye Distance:   Left Eye Distance:   Bilateral Distance:    Right Eye Near:   Left Eye Near:    Bilateral Near:     Physical Exam Nursing notes and Vital Signs reviewed. Appearance:  Patient appears stated age, and in no acute distress Eyes:  Pupils are equal, round, and reactive to light and accomodation.  Extraocular movement is intact.  Conjunctivae are not inflamed  Ears:  Canals normal.  Tympanic membranes normal.  Nose:  Mildly congested turbinates.  No sinus tenderness.  Marland Kitchen  Pharynx:  Normal Neck:  Supple.  Enlarged posterior/lateral nodes are palpated bilaterally, tender to palpation on the left.   Lungs:  Clear  to auscultation.  Breath sounds are equal.  Moving air well. Heart:  Regular rate and rhythm without murmurs, rubs, or gallops.  Abdomen:  Nontender without masses or hepatosplenomegaly.  Bowel sounds are present.  No CVA or flank tenderness.  Extremities:  No edema.  Skin:  No rash present.    UC Treatments / Results  Labs (all labs ordered are listed, but only abnormal results are displayed) Labs Reviewed - No data to display  EKG  EKG Interpretation None       Radiology No results found.  Procedures Procedures (including critical care time)  Medications Ordered in UC Medications - No data to display   Initial Impression / Assessment and Plan / UC Course  I have reviewed the triage vital signs and the nursing notes.  Pertinent labs & imaging results that were available during my care of the patient were reviewed by me and considered in my medical decision making (see chart for details).    There is no evidence of bacterial infection today.   Treat symptomatically for now  Prescription written for Benzonatate (Tessalon) to take at bedtime for night-time cough.  Take plain guaifenesin (1200mg  extended release tabs such as Mucinex) twice daily, with plenty of water, for cough and congestion.  May add Pseudoephedrine (30mg , one or two every 4 to 6 hours) for sinus congestion.  Get adequate rest.   May use Afrin nasal spray (or generic oxymetazoline) each morning for about 5 days and then discontinue.  Also recommend using saline nasal spray several times daily and saline nasal irrigation (AYR is a common brand).  Use Flonase nasal spray each morning after using Afrin nasal spray and saline nasal irrigation. Try warm salt water gargles for sore throat.  Stop all antihistamines for now, and other non-prescription cough/cold preparations. May take Ibuprofen 200mg , 4 tabs every 8 hours with food for body aches, headache, etc. Begin Azithromycin if not improving about one week or  if persistent fever develops (Given a prescription to hold, with an expiration date)  Followup with Family Doctor if not improved in about 10 days.  Final Clinical Impressions(s) / UC Diagnoses   Final diagnoses:  Viral URI with cough    ED Discharge Orders        Ordered    benzonatate (TESSALON) 200 MG capsule     04/11/17 1422    azithromycin (  ZITHROMAX Z-PAK) 250 MG tablet     04/11/17 1423          Lattie Haw, MD 04/17/17 [phone]

## 2017-04-11 NOTE — Discharge Instructions (Signed)
Take plain guaifenesin (1200mg extended release tabs such as Mucinex) twice daily, with plenty of water, for cough and congestion.  May add Pseudoephedrine (30mg, one or two every 4 to 6 hours) for sinus congestion.  Get adequate rest.   °May use Afrin nasal spray (or generic oxymetazoline) each morning for about 5 days and then discontinue.  Also recommend using saline nasal spray several times daily and saline nasal irrigation (AYR is a common brand).  Use Flonase nasal spray each morning after using Afrin nasal spray and saline nasal irrigation. °Try warm salt water gargles for sore throat.  °Stop all antihistamines for now, and other non-prescription cough/cold preparations. °May take Ibuprofen 200mg, 4 tabs every 8 hours with food for body aches, headache, etc. °Begin Azithromycin if not improving about one week or if persistent fever develops  °  °

## 2017-04-11 NOTE — ED Triage Notes (Signed)
Patient reports increasing congestion with sinus pressure over the past 3 days. Took ibuprofen and sudafed at 1130 today. No known fever but did have chills one day.

## 2017-05-21 ENCOUNTER — Ambulatory Visit (INDEPENDENT_AMBULATORY_CARE_PROVIDER_SITE_OTHER): Payer: Commercial Managed Care - PPO | Admitting: Sports Medicine

## 2017-05-21 ENCOUNTER — Encounter: Payer: Self-pay | Admitting: Sports Medicine

## 2017-05-21 DIAGNOSIS — M2550 Pain in unspecified joint: Secondary | ICD-10-CM | POA: Diagnosis not present

## 2017-05-21 MED ORDER — PREDNISONE 50 MG PO TABS
ORAL_TABLET | ORAL | 0 refills | Status: DC
Start: 2017-05-21 — End: 2017-06-04

## 2017-05-21 MED ORDER — CELECOXIB 200 MG PO CAPS
ORAL_CAPSULE | ORAL | 2 refills | Status: DC
Start: 2017-05-21 — End: 2017-10-01

## 2017-05-21 NOTE — Assessment & Plan Note (Signed)
Multiple joint aches, wrists, knees, hands, he does have known osteoarthritis, responded well to a steroid injection temporarily but not so much to Orthovisc. Also has known left trapeziometacarpal arthritis and cervical radiculitis. Because his symptoms are so diffuse we are going to proceed with prednisone, Celebrex, and rheumatoid testing. Light duty at work, return to see me in 2 weeks.

## 2017-05-21 NOTE — Progress Notes (Signed)
Subjective:    I'm seeing this patient as a consultation for: Dr. Sunnie NielsenNatalie Livingston  CC: Widespread aches and pains  HPI: Pleasant 47 year old male, for the past several weeks he said increasing aches and pains in his wrists, knees, hips, back.  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  I treated him in the past for carpal metacarpal osteoarthritis, knee osteoarthritis and cervical radiculitis.  Right now his pains have not declared themselves into one single joint but are simply all over.  Significant morning stiffness, no constitutional symptoms, no family history of rheumatoid arthritis.  I reviewed the past medical history, family history, social history, surgical history, and allergies today and no changes were needed.  Please see the problem list section below in epic for further details.  Past Medical History: Past Medical History:  Diagnosis Date  . DDD (degenerative disc disease), cervical   . Eczema   . Heart murmur   . Migraine   . Nonrheumatic aortic valve insufficiency   . OA (osteoarthritis) of knee    both knees   Past Surgical History: Past Surgical History:  Procedure Laterality Date  . CHOLECYSTECTOMY    . LASIK    . TONSILECTOMY/ADENOIDECTOMY WITH MYRINGOTOMY     Social History: Social History   Socioeconomic History  . Marital status: Married    Spouse name: Not on file  . Number of children: Not on file  . Years of education: Not on file  . Highest education level: Not on file  Occupational History  . Not on file  Social Needs  . Financial resource strain: Not on file  . Food insecurity:    Worry: Not on file    Inability: Not on file  . Transportation needs:    Medical: Not on file    Non-medical: Not on file  Tobacco Use  . Smoking status: Former Games developermoker  . Smokeless tobacco: Never Used  Substance and Sexual Activity  . Alcohol use: No  . Drug use: No  . Sexual activity: Not Currently  Lifestyle  .  Physical activity:    Days per week: Not on file    Minutes per session: Not on file  . Stress: Not on file  Relationships  . Social connections:    Talks on phone: Not on file    Gets together: Not on file    Attends religious service: Not on file    Active member of club or organization: Not on file    Attends meetings of clubs or organizations: Not on file    Relationship status: Not on file  Other Topics Concern  . Not on file  Social History Narrative  . Not on file   Family History: Family History  Problem Relation Age of Onset  . Diabetes Maternal Grandmother   . Stroke Maternal Grandfather    Allergies: No Known Allergies Medications: See med rec.  Review of Systems: No headache, visual changes, nausea, vomiting, diarrhea, constipation, dizziness, abdominal pain, skin rash, fevers, chills, night sweats, weight loss, swollen lymph nodes, body aches, joint swelling, muscle aches, chest pain, shortness of breath, mood changes, visual or auditory hallucinations.   Objective:   General: Well Developed, well nourished, and in no acute distress.  Neuro:  Extra-ocular muscles intact, able to move all 4 extremities, sensation grossly intact.  Deep tendon reflexes tested were normal. Psych: Alert and oriented, mood congruent with affect. ENT:  Ears and nose appear unremarkable.  Hearing grossly  normal. Neck: Unremarkable overall appearance, trachea midline.  No visible thyroid enlargement. Eyes: Conjunctivae and lids appear unremarkable.  Pupils equal and round. Skin: Warm and dry, no rashes noted.  Cardiovascular: Pulses palpable, no extremity edema.  Impression and Recommendations:   This case required medical decision making of moderate complexity.  Polyarthralgia Multiple joint aches, wrists, knees, hands, he does have known osteoarthritis, responded well to a steroid injection temporarily but not so much to Orthovisc. Also has known left trapeziometacarpal arthritis and  cervical radiculitis. Because his symptoms are so diffuse we are going to proceed with prednisone, Celebrex, and rheumatoid testing. Light duty at work, return to see me in 2 weeks.  ___________________________________________ Ihor Austin. Benjamin Stain, M.D., ABFM., CAQSM. Primary Care and Sports Medicine [facility]  Adjunct Instructor of Family Medicine  [employer]

## 2017-05-28 LAB — CBC WITH DIFFERENTIAL/PLATELET
Basophils Absolute: 79 {cells}/uL (ref 0–200)
Basophils Relative: 1.8 %
Eosinophils Absolute: 92 {cells}/uL (ref 15–500)
Eosinophils Relative: 2.1 %
HCT: 42.1 % (ref 38.5–50.0)
Hemoglobin: 15.1 g/dL (ref 13.2–17.1)
Lymphs Abs: 1478 cells/uL (ref 850–3900)
MCH: 31.7 pg (ref 27.0–33.0)
MCHC: 35.9 g/dL (ref 32.0–36.0)
MCV: 88.4 fL (ref 80.0–100.0)
MPV: 9.9 fL (ref 7.5–12.5)
Monocytes Relative: 7.8 %
Neutro Abs: 2407 {cells}/uL (ref 1500–7800)
Neutrophils Relative %: 54.7 %
Platelets: 268 Thousand/uL (ref 140–400)
RBC: 4.76 10*6/uL (ref 4.20–5.80)
RDW: 12.2 % (ref 11.0–15.0)
Total Lymphocyte: 33.6 %
WBC mixed population: 343 cells/uL (ref 200–950)
WBC: 4.4 10*3/uL (ref 3.8–10.8)

## 2017-05-28 LAB — COMPREHENSIVE METABOLIC PANEL
Albumin: 4.3 g/dL (ref 3.6–5.1)
Alkaline phosphatase (APISO): 75 U/L (ref 40–115)
CO2: 26 mmol/L (ref 20–32)
Calcium: 9.6 mg/dL (ref 8.6–10.3)
Chloride: 106 mmol/L (ref 98–110)
Globulin: 2.2 g/dL (calc) (ref 1.9–3.7)
Potassium: 4.4 mmol/L (ref 3.5–5.3)
Total Bilirubin: 0.4 mg/dL (ref 0.2–1.2)
Total Protein: 6.5 g/dL (ref 6.1–8.1)

## 2017-05-28 LAB — COMPREHENSIVE METABOLIC PANEL WITH GFR
AG Ratio: 2 (calc) (ref 1.0–2.5)
ALT: 28 U/L (ref 9–46)
AST: 20 U/L (ref 10–40)
BUN: 24 mg/dL (ref 7–25)
Creat: 0.97 mg/dL (ref 0.60–1.35)
Glucose, Bld: 80 mg/dL (ref 65–99)
Sodium: 138 mmol/L (ref 135–146)

## 2017-05-28 LAB — RHEUMATOID ARTHRITIS DIAGNOSTIC PANEL, COMPREHENSIVE
Cyclic Citrullin Peptide Ab: <16 U (ref <20)
Rheumatoid Factor (IgA): 6 U (ref <=6)
Rheumatoid Factor (IgG): <5 U (ref <=6)
Rheumatoid Factor (IgM): <5 U (ref <=6)
SSA (Ro) (ENA) Antibody, IgG: <1 AI (ref <1.0)
SSB (La) (ENA) Antibody, IgG: <1 AI (ref <1.0)

## 2017-05-28 LAB — PROTEIN ELECTROPHORESIS, SERUM, WITH REFLEX
Albumin ELP: 4.3 g/dL (ref 3.8–4.8)
Alpha 1: 0.2 g/dL (ref 0.2–0.3)
Alpha 2: 0.5 g/dL (ref 0.5–0.9)
Beta 2: 0.4 g/dL (ref 0.2–0.5)
Beta Globulin: 0.4 g/dL (ref 0.4–0.6)
Gamma Globulin: 0.8 g/dL (ref 0.8–1.7)
Total Protein: 6.6 g/dL (ref 6.1–8.1)

## 2017-05-28 LAB — HLA-B27 ANTIGEN: HLA-B27 Antigen: POSITIVE — AB

## 2017-05-28 LAB — CK: Total CK: 82 U/L (ref 44–196)

## 2017-05-28 LAB — URIC ACID: Uric Acid, Serum: 6.3 mg/dL (ref 4.0–8.0)

## 2017-05-28 LAB — SEDIMENTATION RATE: Sed Rate: 2 mm/h (ref 0–15)

## 2017-06-04 ENCOUNTER — Ambulatory Visit (INDEPENDENT_AMBULATORY_CARE_PROVIDER_SITE_OTHER): Payer: Commercial Managed Care - PPO | Admitting: Sports Medicine

## 2017-06-04 ENCOUNTER — Encounter: Payer: Self-pay | Admitting: Sports Medicine

## 2017-06-04 DIAGNOSIS — M1812 Unilateral primary osteoarthritis of first carpometacarpal joint, left hand: Secondary | ICD-10-CM | POA: Diagnosis not present

## 2017-06-04 DIAGNOSIS — M2550 Pain in unspecified joint: Secondary | ICD-10-CM | POA: Diagnosis not present

## 2017-06-04 DIAGNOSIS — M170 Bilateral primary osteoarthritis of knee: Secondary | ICD-10-CM

## 2017-06-04 NOTE — Progress Notes (Signed)
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  He does have localized left carpal metacarpal osteoarthritis, previous injection was over a year ago, desires repeat, pain is moderate, persistent, localized without radiation.  He also has known bilateral knee osteoarthritis, we did a series of Orthovisc about a year ago, Synvisc before that, pain is moderate, persistent, not much improved with prednisone or Celebrex, desires repeat interventional treatment today, pain is localized to the medial joint line, no radiation, no mechanical symptoms.  I reviewed the past medical history, family history, social history, surgical history, and allergies today and no changes were needed.  Please see the problem list section below in epic for further details.  Past Medical History: Past Medical History:  Diagnosis Date  . DDD (degenerative disc disease), cervical   . Eczema   . Heart murmur   . Migraine   . Nonrheumatic aortic valve insufficiency   . OA (osteoarthritis) of knee    both knees   Past Surgical History: Past Surgical History:  Procedure Laterality Date  . CHOLECYSTECTOMY    . LASIK    . TONSILECTOMY/ADENOIDECTOMY WITH MYRINGOTOMY     Social History: Social History   Socioeconomic History  . Marital status: Married    Spouse name: Not on file  . Number of children: Not on file  . Years of education: Not on file  . Highest education level: Not on file  Occupational History  . Not on file  Social Needs  . Financial resource strain: Not on file  . Food insecurity:    Worry: Not on file    Inability: Not on file  . Transportation needs:    Medical: Not on file    Non-medical: Not on file  Tobacco Use  . Smoking status: Former Research scientist (life sciences)  .  Smokeless tobacco: Never Used  Substance and Sexual Activity  . Alcohol use: No  . Drug use: No  . Sexual activity: Not Currently  Lifestyle  . Physical activity:    Days per week: Not on file    Minutes per session: Not on file  . Stress: Not on file  Relationships  . Social connections:    Talks on phone: Not on file    Gets together: Not on file    Attends religious service: Not on file    Active member of club or organization: Not on file    Attends meetings of clubs or organizations: Not on file    Relationship status: Not on file  Other Topics Concern  . Not on file  Social History Narrative  . Not on file   Family History: Family History  Problem Relation Age of Onset  . Diabetes Maternal Grandmother   . Stroke Maternal Grandfather    Allergies: No Known Allergies Medications: See med rec.  Review of Systems: No fevers, chills, night sweats, weight loss, chest pain, or shortness of breath.   Objective:    General: Well Developed, well nourished, and in no acute distress.  Neuro: Alert and oriented x3, extra-ocular muscles intact, sensation grossly intact.  HEENT: Normocephalic, atraumatic, pupils equal round reactive to light, neck supple, no masses, no lymphadenopathy, thyroid nonpalpable.  Skin: Warm and dry, no rashes. Cardiac: Regular rate and rhythm, no murmurs rubs or gallops, no lower extremity edema.  Respiratory: Clear to auscultation bilaterally. Not using accessory muscles, speaking in full sentences. Bilateral knees: Normal to inspection with no erythema or effusion or obvious bony abnormalities. Tender to palpation at the medial joint line ROM normal in flexion and extension and lower leg rotation. Ligaments with solid consistent endpoints including ACL, PCL, LCL, MCL. Negative Mcmurray's and provocative meniscal tests. Non painful patellar compression. Patellar and quadriceps tendons unremarkable. Hamstring and quadriceps strength is  normal. Left hand: Squared off appearance of the thumb basal joint with tenderness at the [facility].  Procedure: Real-time Ultrasound Guided Injection of left knee Device: GE Logiq E  Verbal informed consent obtained.  Time-out conducted.  Noted no overlying erythema, induration, or other signs of local infection.  Skin prepped in a sterile fashion.  Local anesthesia: Topical Ethyl chloride.  With sterile technique and under real time ultrasound guidance: 1 cc Kenalog 40, 2 cc lidocaine, 2 cc bupivacaine injected easily into the suprapatellar recess. Completed without difficulty  Pain immediately resolved suggesting accurate placement of the medication.  Advised to call if fevers/chills, erythema, induration, drainage, or persistent bleeding.  Images permanently stored and available for review in the ultrasound unit.  Impression: Technically successful ultrasound guided injection.  Procedure: Real-time Ultrasound Guided Injection of right knee Device: GE Logiq E  Verbal informed consent obtained.  Time-out conducted.  Noted no overlying erythema, induration, or other signs of local infection.  Skin prepped in a sterile fashion.  Local anesthesia: Topical Ethyl chloride.  With sterile technique and under real time ultrasound guidance: 1 cc Kenalog 40, 2 cc lidocaine, 2 cc bupivacaine injected easily into the suprapatellar recess. Completed without difficulty  Pain immediately resolved suggesting accurate placement of the medication.  Advised to call if fevers/chills, erythema, induration, drainage, or persistent bleeding.  Images permanently stored and available for review in the ultrasound unit.  Impression: Technically successful ultrasound guided injection.  Procedure: Real-time Ultrasound Guided Injection of left first [facility] Device: GE Logiq E  Verbal informed consent obtained.  Time-out conducted.  Noted no overlying erythema, induration, or other signs of local infection.  Skin  prepped in a sterile fashion.  Local anesthesia: Topical Ethyl chloride.  With sterile technique and under real time ultrasound guidance: 25-gauge needle advanced between the first metacarpal and the trapezium, I injected 1/2 cc kenalog 40, 1/2 cc lidocaine easily Completed without difficulty  Pain immediately resolved suggesting accurate placement of the medication.  Advised to call if fevers/chills, erythema, induration, drainage, or persistent bleeding.  Images permanently stored and available for review in the ultrasound unit.  Impression: Technically successful ultrasound guided injection.  Impression and Recommendations:    Primary osteoarthritis of both knees Insufficient relief with a series of Orthovisc last year, Synvisc before that. He does have degenerative changes, meniscal tear, repeating a bilateral steroid injection, if this does not provide good long-term relief he will need to proceed to arthroscopy again. He did have a positive HLA-B27, I do not really think this is relevant to his clinical picture.  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 Again, I do not think his HLA-B27 is relevant here. If this fails he will need a thumb suspension surgery.  Polyarthralgia Rheumatoid testing is negative with the exception of a positive HLA-B27, his presentation is not consistent with psoriatic arthritis, or the spondyloarthropathies. Negative ANA, CCP, RF, normal ESR, I would like him to touch base with rheumatology. ___________________________________________ Gwen Her. Dianah Field, M.D., ABFM., CAQSM.  Primary Care and [place] Instructor of [place] [facility]

## 2017-06-04 NOTE — Assessment & Plan Note (Addendum)
Rheumatoid testing is negative with the exception of a positive HLA-B27, his presentation is not consistent with psoriatic arthritis, or the spondyloarthropathies. Negative ANA, CCP, RF, normal ESR, I would like him to touch base with rheumatology.

## 2017-06-04 NOTE — Assessment & Plan Note (Signed)
Repeat first [place] injection today, previous injection was last summer. Again, I do not think his HLA-B27 is relevant here. If this fails he will need a thumb suspension surgery.

## 2017-06-04 NOTE — Assessment & Plan Note (Signed)
Insufficient relief with a series of Orthovisc last year, Synvisc before that. He does have degenerative changes, meniscal tear, repeating a bilateral steroid injection, if this does not provide good long-term relief he will need to proceed to arthroscopy again. He did have a positive HLA-B27, I do not really think this is relevant to his clinical picture.

## 2017-07-16 ENCOUNTER — Ambulatory Visit: Payer: Commercial Managed Care - PPO | Admitting: Sports Medicine

## 2017-08-17 NOTE — Progress Notes (Signed)
Office Visit Note  Patient: Ryan Livingston             Date of Birth: 04-25-1970           MRN: [id number]             PCP: Emeterio Reeve, DO Referring: Silverio Decamp,* Visit Date: 08/31/2017 Occupation: [employer]    Subjective:  Pain in multiple joints.   History of Present Illness: Ryan Livingston is a 47 y.o. male seen in consultation per request of Dr. Dianah Field.  According to patient in 2015 he was diagnosed with bilateral torn meniscus and had repair for that.  His symptoms recurred in 2016 and he was seen by Dr. Dianah Field.  He diagnosed him with osteoarthritis of his knee joints.  He states he had cortisone injections followed by Visco supplement injections which did not help.  Swelling in his knee joints.  He has been having discomfort in his left CMC joint.  He also complains of pain in his bilateral hands, bilateral wrist joints and left elbow.  He has difficulty opening jars and bottles.  He has noticed just mild swelling in his hands only.  He has had neck pain for the last 1 year which is constant.  He states he has been seeing a physiatrist and had cortisone injection x1 and he is scheduled to have another one.  He has occasional lower back pain which she describes in the left hip area.  He states he still has a stiffness last almost all day.  There is no history of psoriasis.  There is no family history of psoriasis, ulcerative colitis, Crohn's disease or ankylosing spondylitis.  Activities of Daily Living:  Patient reports morning stiffness for all day hours.   Patient Reports nocturnal pain.  Difficulty dressing/grooming: Denies Difficulty climbing stairs: Reports Difficulty getting out of chair: Reports Difficulty using hands for taps, buttons, cutlery, and/or writing: Reports   Review of Systems  Constitutional: Negative for fatigue and night sweats.  HENT: Negative for mouth sores, mouth dryness and nose dryness.   Eyes: Negative for redness and dryness.   Respiratory: Negative for shortness of breath and difficulty breathing.   Cardiovascular: Negative for chest pain, palpitations, hypertension, irregular heartbeat and swelling in legs/feet.  Gastrointestinal: Negative for constipation and diarrhea.  Endocrine: Negative for increased urination.  Musculoskeletal: Positive for arthralgias, joint pain, joint swelling and morning stiffness. Negative for myalgias, muscle weakness, muscle tenderness and myalgias.  Skin: Negative for color change, rash, hair loss, nodules/bumps, skin tightness, ulcers and sensitivity to sunlight.  Allergic/Immunologic: Negative for susceptible to infections.  Neurological: Negative for dizziness, fainting, memory loss, night sweats and weakness ( ).  Hematological: Negative for swollen glands.  Psychiatric/Behavioral: Negative for depressed mood and sleep disturbance. The patient is not nervous/anxious.     PMFS History:  Patient Active Problem List   Diagnosis Date Noted  . Polyarthralgia 05/21/2017  . Malaise 01/19/2017  . Cough present for greater than 3 weeks 01/19/2017  . Night sweats 01/19/2017  . Colicky LUQ abdominal pain 08/05/2016  . Other headache syndrome 08/05/2016  . Eczema 08/05/2016  . DDD (degenerative disc disease), cervical 01/30/2016  . Bilateral foot pain 01/30/2016  . Annual physical exam 11/01/2015  . Primary osteoarthritis of both knees 10/25/2015  . 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 DDD (degenerative disc disease), cervical   . Eczema   . Heart murmur   .  Migraine   . Nonrheumatic aortic valve insufficiency   . OA (osteoarthritis) of knee    both knees    Family History  Problem Relation Age of Onset  . Diabetes Maternal Grandmother   . Stroke Maternal Grandfather   . HIV Father   . Healthy Son   . Healthy Son    Past Surgical History:  Procedure Laterality Date  . CHOLECYSTECTOMY    . KNEE  ARTHROPLASTY Bilateral 2015   meniscal repair   . LASIK    . TONSILECTOMY/ADENOIDECTOMY WITH MYRINGOTOMY     Social History   Social History Narrative  . Not on file     Objective: Vital Signs: BP 123/82 (BP Location: Right Arm, Patient Position: Sitting, Cuff Size: Normal)   Pulse 71   Resp 15   Ht 5' 10"  (1.778 m)   Wt 199 lb (90.3 kg)   BMI 28.55 kg/m    Physical Exam  Constitutional: He is oriented to person, place, and time. He appears well-developed and well-nourished.  HENT:  Head: Normocephalic and atraumatic.  Eyes: Pupils are equal, round, and reactive to light. Conjunctivae and EOM are normal.  Neck: Normal range of motion. Neck supple.  Cardiovascular: Normal rate, regular rhythm and normal heart sounds.  Pulmonary/Chest: Effort normal and breath sounds normal.  Abdominal: Soft. Bowel sounds are normal.  Neurological: He is alert and oriented to person, place, and time.  Skin: Skin is warm and dry. Capillary refill takes less than 2 seconds.  Psychiatric: He has a normal mood and affect. His behavior is normal.  Nursing note and vitals reviewed.    Musculoskeletal Exam: C-spine full range of motion with some discomfort.  He had good range of motion of his thoracic spine.  It discomfort range of motion of his lumbar spine.  He has some tenderness on palpation over left SI joint.  Shoulder joints elbow joints wrist joints MCPs were in good range of motion.  He had bilateral PIP and DIP thickening with no synovitis.  Hip joints were in good range of motion.  He has no warmth swelling or effusion in his knee joints.  He discomfort with range of motion of his knee joints.  There was no evidence of plantar fasciitis or Achilles tendinitis no synovitis noted in MTPs PIPs or DIPs of his feet.   CDAI Exam: No CDAI exam completed.    Investigation: Findings:  05/21/2017: HLA-B27 positive, sed rate 2, uric acid 6.3, CK 82, La < 1, Ro < 1, CCP < 16, RF < 5, SPEP within  normal limits   Component     Latest Ref Rng & Units 05/21/2017  Total Protein     6.1 - 8.1 g/dL 6.6  Albumin ELP     3.8 - 4.8 g/dL 4.3  Alpha 1     0.2 - 0.3 g/dL 0.2  Alpha 2     0.5 - 0.9 g/dL 0.5  Beta Globulin     0.4 - 0.6 g/dL 0.4  Beta 2     0.2 - 0.5 g/dL 0.4  Gamma Globulin     0.8 - 1.7 g/dL 0.8  SPE Interp.        Rheumatoid Factor (IgG)     <=6 U <5  Rheumatoid Factor (IgA)     <=6 U 6  Rheumatoid Factor (IgM)     <=6 U <5  Cyclic Citrullin Peptide Ab     <20 Units <16  SSA (Ro) (ENA) Antibody, IgG     <  1.0 AI <1.0  SSB (La) (ENA) Antibody, IgG     <1.0 AI <1.0  CK Total     44 - 196 U/L 82  Uric Acid, Serum     4.0 - 8.0 mg/dL 6.3  Sed Rate     0 - 15 mm/h 2  HLA-B27 Antigen     NEGATIVE POSITIVE (A)   CBC Latest Ref Rng & Units 05/21/2017 02/19/2017  WBC 3.8 - 10.8 Thousand/uL 4.4 5.9  Hemoglobin 13.2 - 17.1 g/dL 15.1 15.6  Hematocrit 38.5 - 50.0 % 42.1 44.5  Platelets 140 - 400 Thousand/uL 268 274   CMP Latest Ref Rng & Units 05/21/2017 05/21/2017 02/19/2017  Glucose 65 - 99 mg/dL - 80 89  BUN 7 - 25 mg/dL - 24 17  Creatinine 0.60 - 1.35 mg/dL - 0.97 1.08  Sodium 135 - 146 mmol/L - 138 139  Potassium 3.5 - 5.3 mmol/L - 4.4 4.2  Chloride 98 - 110 mmol/L - 106 105  CO2 20 - 32 mmol/L - 26 28  Calcium 8.6 - 10.3 mg/dL - 9.6 9.7  Total Protein 6.1 - 8.1 g/dL 6.6 6.5 6.7  Total Bilirubin 0.2 - 1.2 mg/dL - 0.4 0.4  AST 10 - 40 U/L - 20 19  ALT 9 - 46 U/L - 28 30     Imaging: Xr Hand 2 View Left  Result Date: 08/31/2017 CMC narrowing and spurring was noted.  No MCP narrowing was noted.  Mild PIP and DIP narrowing was noted.  No intercarpal radiocarpal joint space narrowing was noted.  No erosive changes were noted. Impression: These findings are consistent with osteoarthritis of the hand.  Xr Hand 2 View Right  Result Date: 08/31/2017 [facility] spurring was noted.  PIP and DIP narrowing was noted.  No MCP, intercarpal radiocarpal joint space  narrowing was noted.  No erosive changes were noted. Impression: These findings are consistent with osteoarthritis of the hand.  Xr Knee 3 View Left  Result Date: 08/31/2017 Mild medial compartment narrowing was noted.  No chondrocalcinosis was noted.  Mild patellofemoral narrowing was noted. Impression: These findings are consistent with mild osteoarthritis and mild chondromalacia patella.  Xr Knee 3 View Right  Result Date: 08/31/2017 Mild medial compartment narrowing was noted.  No chondrocalcinosis was noted.  Mild patellofemoral narrowing was noted. Impression: These findings are consistent with mild osteoarthritis and mild chondromalacia patella.  Xr Lumbar Spine 2-3 Views  Result Date: 08/31/2017 Mild anterior spurring was noted.  Mild facet joint arthropathy was noted.  No significant disc space narrowing was noted.  No syndesmophytes were noted.  Mild levoscoliosis was noted.  Xr Pelvis 1-2 Views  Result Date: 08/31/2017 No SI joint to sclerosis or narrowing was noted. Impression: Unremarkable x-ray of the SI joints.   Speciality Comments: No specialty comments available.    Procedures:  No procedures performed Allergies: Patient has no known allergies.   Assessment / Plan:     Visit Diagnoses: Polyarthralgia - 05/21/2017: HLA-B27 positive, sed rate 2, uric acid 6.3, CK 82, La < 1, Ro < 1, CCP < 16, RF < 5, SPEP within normal limits  Pain in both hands - Plan: XR Hand 2 View Right, XR Hand 2 View Left.  Clinical findings are consistent with osteoarthritis involving CMC PIP DIP joints.  No synovitis was noted.  X-ray showed only mild osteoarthritic changes.  Joint protection muscle strengthening was discussed.  A handout on knee joint anti-inflammatories was given.  Chronic pain of both  knees - Plan: XR KNEE 3 VIEW RIGHT, XR KNEE 3 VIEW LEFT.  X-rays were consistent with mild osteoarthritis and mild chondromalacia patella.  Patient had cortisone injections and Visco supplement  injections by Dr. Dianah Field in the past and had no relief per patient.  Patient is concerned that he has a repeat meniscal tear injury.  He will discuss that further with Dr. Dianah Field.  Primary osteoarthritis of both knees-handout on knee joint muscle strengthening exercise was given.  I also offered physical therapy which he declined.  Primary osteoarthritis of first carpometacarpal joint of left hand-he does have tenderness on palpation over left CMC joint.  Discussed.  A CMC brace was offered.  She declined.  Chronic SI joint pain - Plan: XR Pelvis 1-2 Views.  The SI joints were unremarkable.  DDD (degenerative disc disease), cervical-patient states that he has been getting epidural injections and continues to have discomfort in C-spine.  Joint protection was Chronic bilateral low back pain without sciatica - Plan: XR Lumbar Spine 2-3 Views.  The x-ray showed mild levoscoliosis and mild anterior spurring.  A handout on back exercises was given.  Other eczema  Nonrheumatic aortic (valve) insufficiency  History of migraine    Orders: Orders Placed This Encounter  Procedures  . XR Pelvis 1-2 Views  . XR KNEE 3 VIEW RIGHT  . XR KNEE 3 VIEW LEFT  . XR Lumbar Spine 2-3 Views  . XR Hand 2 View Right  . XR Hand 2 View Left   No orders of the defined types were placed in this encounter.   Face-to-face time spent with patient was 50 minutes. Greater than 50% of time was spent in counseling and coordination of care.  Follow-Up Instructions: Return for polyarthralgia, HLA B27.   Bo Merino, MD  Note - This record has been created using Editor, commissioning.  Chart creation errors have been sought, but may not always  have been located. Such creation errors do not reflect on  the standard of medical care.

## 2017-08-20 ENCOUNTER — Telehealth: Payer: Self-pay | Admitting: Osteopathic Medicine

## 2017-08-20 DIAGNOSIS — M5030 Other cervical disc degeneration, unspecified cervical region: Secondary | ICD-10-CM

## 2017-08-20 NOTE — Telephone Encounter (Signed)
Orders placed, please contact [facility] for scheduling. 

## 2017-08-20 NOTE — Telephone Encounter (Signed)
Pt came by today. He would like to go ahead with referral to get another epidural.

## 2017-08-20 NOTE — Telephone Encounter (Signed)
Done

## 2017-08-31 ENCOUNTER — Encounter: Payer: Self-pay | Admitting: Rheumatology

## 2017-08-31 ENCOUNTER — Ambulatory Visit (INDEPENDENT_AMBULATORY_CARE_PROVIDER_SITE_OTHER): Payer: Self-pay

## 2017-08-31 ENCOUNTER — Ambulatory Visit (INDEPENDENT_AMBULATORY_CARE_PROVIDER_SITE_OTHER): Payer: Commercial Managed Care - PPO | Admitting: Rheumatology

## 2017-08-31 VITALS — BP 123/82 | HR 71 | Resp 15 | Ht 70.0 in | Wt 199.0 lb

## 2017-08-31 DIAGNOSIS — I351 Nonrheumatic aortic (valve) insufficiency: Secondary | ICD-10-CM

## 2017-08-31 DIAGNOSIS — M79642 Pain in left hand: Secondary | ICD-10-CM

## 2017-08-31 DIAGNOSIS — M79641 Pain in right hand: Secondary | ICD-10-CM

## 2017-08-31 DIAGNOSIS — M2550 Pain in unspecified joint: Secondary | ICD-10-CM | POA: Diagnosis not present

## 2017-08-31 DIAGNOSIS — G8929 Other chronic pain: Secondary | ICD-10-CM

## 2017-08-31 DIAGNOSIS — M5030 Other cervical disc degeneration, unspecified cervical region: Secondary | ICD-10-CM

## 2017-08-31 DIAGNOSIS — M25562 Pain in left knee: Secondary | ICD-10-CM

## 2017-08-31 DIAGNOSIS — L308 Other specified dermatitis: Secondary | ICD-10-CM

## 2017-08-31 DIAGNOSIS — M545 Other chronic pain: Secondary | ICD-10-CM

## 2017-08-31 DIAGNOSIS — Z8669 Personal history of other diseases of the nervous system and sense organs: Secondary | ICD-10-CM

## 2017-08-31 DIAGNOSIS — M170 Bilateral primary osteoarthritis of knee: Secondary | ICD-10-CM | POA: Diagnosis not present

## 2017-08-31 DIAGNOSIS — M1812 Unilateral primary osteoarthritis of first carpometacarpal joint, left hand: Secondary | ICD-10-CM

## 2017-08-31 DIAGNOSIS — M25561 Pain in right knee: Secondary | ICD-10-CM | POA: Diagnosis not present

## 2017-08-31 DIAGNOSIS — M533 Sacrococcygeal disorders, not elsewhere classified: Secondary | ICD-10-CM

## 2017-08-31 NOTE — Patient Instructions (Signed)
Natural anti-inflammatories  You can purchase these at [employer], [employer] or online.  . Turmeric (capsules)  . Ginger (ginger root or capsules)  . Omega 3 (Fish, flax seeds, chia seeds, walnuts, almonds)  . Tart cherry (dried or extract)   Patient should be under the care of a physician while taking these supplements. This may not be reproduced without the permission of Dr. Emilliano Dilworth.    Knee Exercises Ask your health care provider which exercises are safe for you. Do exercises exactly as told by your health care provider and adjust them as directed. It is normal to feel mild stretching, pulling, tightness, or discomfort as you do these exercises, but you should stop right away if you feel sudden pain or your pain gets worse.Do not begin these exercises until told by your health care provider. STRETCHING AND RANGE OF MOTION EXERCISES These exercises warm up your muscles and joints and improve the movement and flexibility of your knee. These exercises also help to relieve pain, numbness, and tingling. Exercise A: Knee Extension, Prone 1. Lie on your abdomen on a bed. 2. Place your left / right knee just beyond the edge of the surface so your knee is not on the bed. You can put a towel under your left / right thigh just above your knee for comfort. 3. Relax your leg muscles and allow gravity to straighten your knee. You should feel a stretch behind your left / right knee. 4. Hold this position for __________ seconds. 5. Scoot up so your knee is supported between repetitions. Repeat __________ times. Complete this stretch __________ times a day. Exercise B: Knee Flexion, Active  1. Lie on your back with both knees straight. If this causes back discomfort, bend your left / right knee so your foot is flat on the floor. 2. Slowly slide your left / right heel back toward your buttocks until you feel a gentle stretch in the front of your knee or thigh. 3. Hold this position  for __________ seconds. 4. Slowly slide your left / right heel back to the starting position. Repeat __________ times. Complete this exercise __________ times a day. Exercise C: Quadriceps, Prone  1. Lie on your abdomen on a firm surface, such as a bed or padded floor. 2. Bend your left / right knee and hold your ankle. If you cannot reach your ankle or pant leg, loop a belt around your foot and grab the belt instead. 3. Gently pull your heel toward your buttocks. Your knee should not slide out to the side. You should feel a stretch in the front of your thigh and knee. 4. Hold this position for __________ seconds. Repeat __________ times. Complete this stretch __________ times a day. Exercise D: Hamstring, Supine 1. Lie on your back. 2. Loop a belt or towel over the ball of your left / right foot. The ball of your foot is on the walking surface, right under your toes. 3. Straighten your left / right knee and slowly pull on the belt to raise your leg until you feel a gentle stretch behind your knee. ? Do not let your left / right knee bend while you do this. ? Keep your other leg flat on the floor. 4. Hold this position for __________ seconds. Repeat __________ times. Complete this stretch __________ times a day. STRENGTHENING EXERCISES These exercises build strength and endurance in your knee. Endurance is the ability to use your muscles for a long time, even after they get tired.   Exercise E: Quadriceps, Isometric  1. Lie on your back with your left / right leg extended and your other knee bent. Put a rolled towel or small pillow under your knee if told by your health care provider. 2. Slowly tense the muscles in the front of your left / right thigh. You should see your kneecap slide up toward your hip or see increased dimpling just above the knee. This motion will push the back of the knee toward the floor. 3. For __________ seconds, keep the muscle as tight as you can without increasing  your pain. 4. Relax the muscles slowly and completely. Repeat __________ times. Complete this exercise __________ times a day. Exercise F: Straight Leg Raises - Quadriceps 1. Lie on your back with your left / right leg extended and your other knee bent. 2. Tense the muscles in the front of your left / right thigh. You should see your kneecap slide up or see increased dimpling just above the knee. Your thigh may even shake a bit. 3. Keep these muscles tight as you raise your leg 4-6 inches (10-15 cm) off the floor. Do not let your knee bend. 4. Hold this position for __________ seconds. 5. Keep these muscles tense as you lower your leg. 6. Relax your muscles slowly and completely after each repetition. Repeat __________ times. Complete this exercise __________ times a day. Exercise G: Hamstring, Isometric 1. Lie on your back on a firm surface. 2. Bend your left / right knee approximately __________ degrees. 3. Dig your left / right heel into the surface as if you are trying to pull it toward your buttocks. Tighten the muscles in the back of your thighs to dig as hard as you can without increasing any pain. 4. Hold this position for __________ seconds. 5. Release the tension gradually and allow your muscles to relax completely for __________ seconds after each repetition. Repeat __________ times. Complete this exercise __________ times a day. Exercise H: Hamstring Curls  If told by your health care provider, do this exercise while wearing ankle weights. Begin with __________ weights. Then increase the weight by 1 lb (0.5 kg) increments. Do not wear ankle weights that are more than __________. 1. Lie on your abdomen with your legs straight. 2. Bend your left / right knee as far as you can without feeling pain. Keep your hips flat against the floor. 3. Hold this position for __________ seconds. 4. Slowly lower your leg to the starting position.  Repeat __________ times. Complete this exercise  __________ times a day. Exercise I: Squats (Quadriceps) 1. Stand in front of a table, with your feet and knees pointing straight ahead. You may rest your hands on the table for balance but not for support. 2. Slowly bend your knees and lower your hips like you are going to sit in a chair. ? Keep your weight over your heels, not over your toes. ? Keep your lower legs upright so they are parallel with the table legs. ? Do not let your hips go lower than your knees. ? Do not bend lower than told by your health care provider. ? If your knee pain increases, do not bend as low. 3. Hold the squat position for __________ seconds. 4. Slowly push with your legs to return to standing. Do not use your hands to pull yourself to standing. Repeat __________ times. Complete this exercise __________ times a day. Exercise J: Wall Slides (Quadriceps)  1. Lean your back against a smooth wall or   door while you walk your feet out 18-24 inches (46-61 cm) from it. 2. Place your feet hip-width apart. 3. Slowly slide down the wall or door until your knees bend __________ degrees. Keep your knees over your heels, not over your toes. Keep your knees in line with your hips. 4. Hold for __________ seconds. Repeat __________ times. Complete this exercise __________ times a day. Exercise K: Straight Leg Raises - Hip Abductors 1. Lie on your side with your left / right leg in the top position. Lie so your head, shoulder, knee, and hip line up. You may bend your bottom knee to help you keep your balance. 2. Roll your hips slightly forward so your hips are stacked directly over each other and your left / right knee is facing forward. 3. Leading with your heel, lift your top leg 4-6 inches (10-15 cm). You should feel the muscles in your outer hip lifting. ? Do not let your foot drift forward. ? Do not let your knee roll toward the ceiling. 4. Hold this position for __________ seconds. 5. Slowly return your leg to the starting  position. 6. Let your muscles relax completely after each repetition. Repeat __________ times. Complete this exercise __________ times a day. Exercise L: Straight Leg Raises - Hip Extensors 1. Lie on your abdomen on a firm surface. You can put a pillow under your hips if that is more comfortable. 2. Tense the muscles in your buttocks and lift your left / right leg about 4-6 inches (10-15 cm). Keep your knee straight as you lift your leg. 3. Hold this position for __________ seconds. 4. Slowly lower your leg to the starting position. 5. Let your leg relax completely after each repetition. Repeat __________ times. Complete this exercise __________ times a day. This information is not intended to replace advice given to you by your health care provider. Make sure you discuss any questions you have with your health care provider. Document Released: 12/25/2004 Document Revised: 11/05/2015 Document Reviewed: 12/17/2014 [employer] Interactive Patient Education  2018 [employer] Hand Exercises Hand exercises can be helpful to almost anyone. These exercises can strengthen the hands, improve flexibility and movement, and increase blood flow to the hands. These results can make work and daily tasks easier. Hand exercises can be especially helpful for people who have joint pain from arthritis or have nerve damage from overuse (carpal tunnel syndrome). These exercises can also help people who have injured a hand. Most of these hand exercises are fairly gentle stretching routines. You can do them often throughout the day. Still, it is a good idea to ask your health care provider which exercises would be best for you. Warming your hands before exercise may help to reduce stiffness. You can do this with gentle massage or by placing your hands in warm water for 15 minutes. Also, make sure you pay attention to your level of hand pain as you begin an exercise routine. Exercises Knuckle Bend Repeat this exercise 5-10  times with each hand. 1. Stand or sit with your arm, hand, and all five fingers pointed straight up. Make sure your wrist is straight. 2. Gently and slowly bend your fingers down and inward until the tips of your fingers are touching the tops of your palm. 3. Hold this position for a few seconds. 4. Extend your fingers out to their original position, all pointing straight up again.  Finger Fan Repeat this exercise 5-10 times with each hand. 1. Hold your arm and hand out   in front of you. Keep your wrist straight. 2. Squeeze your hand into a fist. 3. Hold this position for a few seconds. 4. Fan out, or spread apart, your hand and fingers as much as possible, stretching every joint fully.  Tabletop Repeat this exercise 5-10 times with each hand. 1. Stand or sit with your arm, hand, and all five fingers pointed straight up. Make sure your wrist is straight. 2. Gently and slowly bend your fingers at the knuckles where they meet the hand until your hand is making an upside-down L shape. Your fingers should form a tabletop. 3. Hold this position for a few seconds. 4. Extend your fingers out to their original position, all pointing straight up again.  Making Os Repeat this exercise 5-10 times with each hand. 1. Stand or sit with your arm, hand, and all five fingers pointed straight up. Make sure your wrist is straight. 2. Make an O shape by touching your pointer finger to your thumb. Hold for a few seconds. Then open your hand wide. 3. Repeat this motion with each finger on your hand.  Table Spread Repeat this exercise 5-10 times with each hand. 1. Place your hand on a table with your palm facing down. Make sure your wrist is straight. 2. Spread your fingers out as much as possible. Hold this position for a few seconds. 3. Slide your fingers back together again. Hold for a few seconds.  Ball Grip  Repeat this exercise 10-15 times with each hand. 1. Hold a tennis ball or another soft ball  in your hand. 2. While slowly increasing pressure, squeeze the ball as hard as possible. 3. Squeeze as hard as you can for 3-5 seconds. 4. Relax and repeat.  Wrist Curls Repeat this exercise 10-15 times with each hand. 1. Sit in a chair that has armrests. 2. Hold a light weight in your hand, such as a dumbbell that weighs 1-3 pounds (0.5-1.4 kg). Ask your health care provider what weight would be best for you. 3. Rest your hand just over the end of the chair arm with your palm facing up. 4. Gently pivot your wrist up and down while holding the weight. Do not twist your wrist from side to side.  Contact a health care provider if:  Your hand pain or discomfort gets much worse when you do an exercise.  Your hand pain or discomfort does not improve within 2 hours after you exercise. If you have any of these problems, stop doing these exercises right away. Do not do them again unless your health care provider says that you can. Get help right away if:  You develop sudden, severe hand pain. If this happens, stop doing these exercises right away. Do not do them again unless your health care provider says that you can. This information is not intended to replace advice given to you by your health care provider. Make sure you discuss any questions you have with your health care provider. Document Released: 01/22/2015 Document Revised: 07/19/2015 Document Reviewed: 08/21/2014 [employer] Interactive Patient Education  2018 [employer] Back Exercises The following exercises strengthen the muscles that help to support the back. They also help to keep the lower back flexible. Doing these exercises can help to prevent back pain or lessen existing pain. If you have back pain or discomfort, try doing these exercises 2-3 times each day or as told by your health care provider. When the pain goes away, do them once each day, but increase the   number of times that you repeat the steps for each exercise (do more  repetitions). If you do not have back pain or discomfort, do these exercises once each day or as told by your health care provider. Exercises Single Knee to Chest  Repeat these steps 3-5 times for each leg: 1. Lie on your back on a firm bed or the floor with your legs extended. 2. Bring one knee to your chest. Your other leg should stay extended and in contact with the floor. 3. Hold your knee in place by grabbing your knee or thigh. 4. Pull on your knee until you feel a gentle stretch in your lower back. 5. Hold the stretch for 10-30 seconds. 6. Slowly release and straighten your leg.  Pelvic Tilt  Repeat these steps 5-10 times: 1. Lie on your back on a firm bed or the floor with your legs extended. 2. Bend your knees so they are pointing toward the ceiling and your feet are flat on the floor. 3. Tighten your lower abdominal muscles to press your lower back against the floor. This motion will tilt your pelvis so your tailbone points up toward the ceiling instead of pointing to your feet or the floor. 4. With gentle tension and even breathing, hold this position for 5-10 seconds.  Cat-Cow  Repeat these steps until your lower back becomes more flexible: 1. Get into a hands-and-knees position on a firm surface. Keep your hands under your shoulders, and keep your knees under your hips. You may place padding under your knees for comfort. 2. Let your head hang down, and point your tailbone toward the floor so your lower back becomes rounded like the back of a cat. 3. Hold this position for 5 seconds. 4. Slowly lift your head and point your tailbone up toward the ceiling so your back forms a sagging arch like the back of a cow. 5. Hold this position for 5 seconds.  Press-Ups  Repeat these steps 5-10 times: 1. Lie on your abdomen (face-down) on the floor. 2. Place your palms near your head, about shoulder-width apart. 3. While you keep your back as relaxed as possible and keep your hips on  the floor, slowly straighten your arms to raise the top half of your body and lift your shoulders. Do not use your back muscles to raise your upper torso. You may adjust the placement of your hands to make yourself more comfortable. 4. Hold this position for 5 seconds while you keep your back relaxed. 5. Slowly return to lying flat on the floor.  Bridges  Repeat these steps 10 times: 1. Lie on your back on a firm surface. 2. Bend your knees so they are pointing toward the ceiling and your feet are flat on the floor. 3. Tighten your buttocks muscles and lift your buttocks off of the floor until your waist is at almost the same height as your knees. You should feel the muscles working in your buttocks and the back of your thighs. If you do not feel these muscles, slide your feet 1-2 inches farther away from your buttocks. 4. Hold this position for 3-5 seconds. 5. Slowly lower your hips to the starting position, and allow your buttocks muscles to relax completely.  If this exercise is too easy, try doing it with your arms crossed over your chest. Abdominal Crunches  Repeat these steps 5-10 times: 1. Lie on your back on a firm bed or the floor with your legs extended. 2. Bend your   knees so they are pointing toward the ceiling and your feet are flat on the floor. 3. Cross your arms over your chest. 4. Tip your chin slightly toward your chest without bending your neck. 5. Tighten your abdominal muscles and slowly raise your trunk (torso) high enough to lift your shoulder blades a tiny bit off of the floor. Avoid raising your torso higher than that, because it can put too much stress on your low back and it does not help to strengthen your abdominal muscles. 6. Slowly return to your starting position.  Back Lifts Repeat these steps 5-10 times: 1. Lie on your abdomen (face-down) with your arms at your sides, and rest your forehead on the floor. 2. Tighten the muscles in your legs and your  buttocks. 3. Slowly lift your chest off of the floor while you keep your hips pressed to the floor. Keep the back of your head in line with the curve in your back. Your eyes should be looking at the floor. 4. Hold this position for 3-5 seconds. 5. Slowly return to your starting position.  Contact a health care provider if:  Your back pain or discomfort gets much worse when you do an exercise.  Your back pain or discomfort does not lessen within 2 hours after you exercise. If you have any of these problems, stop doing these exercises right away. Do not do them again unless your health care provider says that you can. Get help right away if:  You develop sudden, severe back pain. If this happens, stop doing the exercises right away. Do not do them again unless your health care provider says that you can. This information is not intended to replace advice given to you by your health care provider. Make sure you discuss any questions you have with your health care provider. Document Released: 03/20/2004 Document Revised: 06/20/2015 Document Reviewed: 04/06/2014 [employer] Interactive Patient Education  2017 [employer]  

## 2017-09-04 ENCOUNTER — Ambulatory Visit
Admission: RE | Admit: 2017-09-04 | Discharge: 2017-09-04 | Disposition: A | Discharge status: Home / Self Care | Payer: Commercial Managed Care - PPO | Source: Ambulatory Visit | Attending: Sports Medicine | Admitting: Sports Medicine

## 2017-09-04 MED ORDER — IOPAMIDOL (ISOVUE-M 300) INJECTION 61%
1.0000 mL | Freq: Once | INTRAMUSCULAR | Status: AC | PRN
  Administered 2017-09-04: 1 mL via EPIDURAL

## 2017-09-04 MED ORDER — TRIAMCINOLONE ACETONIDE 40 MG/ML IJ SUSP (RADIOLOGY)
60.0000 mg | Freq: Once | INTRAMUSCULAR | Status: AC
  Administered 2017-09-04: 60 mg via EPIDURAL

## 2017-09-04 NOTE — Discharge Instructions (Signed)

## 2017-09-25 DIAGNOSIS — M5136 Other intervertebral disc degeneration, lumbar region: Secondary | ICD-10-CM | POA: Insufficient documentation

## 2017-09-25 DIAGNOSIS — M19041 Primary osteoarthritis, right hand: Secondary | ICD-10-CM | POA: Insufficient documentation

## 2017-09-25 DIAGNOSIS — M19042 Primary osteoarthritis, left hand: Secondary | ICD-10-CM

## 2017-09-25 NOTE — Progress Notes (Deleted)
Office Visit Note  Patient: Ryan Livingston             Date of Birth: 06-20-1970           MRN: [id number]             PCP: Emeterio Reeve, DO Referring: Emeterio Reeve, DO Visit Date: 10/07/2017 Occupation: @GUAROCC @  Subjective:  No chief complaint on file.   History of Present Illness: Ryan Livingston is a 47 y.o. male ***   Activities of Daily Living:  Patient reports morning stiffness for *** {minute/hour:19697}.   Patient {ACTIONS;DENIES/REPORTS:21021675::"Denies"} nocturnal pain.  Difficulty dressing/grooming: {ACTIONS;DENIES/REPORTS:21021675::"Denies"} Difficulty climbing stairs: {ACTIONS;DENIES/REPORTS:21021675::"Denies"} Difficulty getting out of chair: {ACTIONS;DENIES/REPORTS:21021675::"Denies"} Difficulty using hands for taps, buttons, cutlery, and/or writing: {ACTIONS;DENIES/REPORTS:21021675::"Denies"}  No Rheumatology ROS completed.   PMFS History:  Patient Active Problem List   Diagnosis Date Noted  . Polyarthralgia 05/21/2017  . Malaise 01/19/2017  . Cough present for greater than 3 weeks 01/19/2017  . Night sweats 01/19/2017  . Colicky LUQ abdominal pain 08/05/2016  . Other headache syndrome 08/05/2016  . Eczema 08/05/2016  . DDD (degenerative disc disease), cervical 01/30/2016  . Bilateral foot pain 01/30/2016  . Annual physical exam 11/01/2015  . Primary osteoarthritis of both knees 10/25/2015  . 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 DDD (degenerative disc disease), cervical   . Eczema   . Heart murmur   . Migraine   . Nonrheumatic aortic valve insufficiency   . OA (osteoarthritis) of knee    both knees    Family History  Problem Relation Age of Onset  . Diabetes Maternal Grandmother   . Stroke Maternal Grandfather   . HIV Father   . Healthy Son   . Healthy Son    Past Surgical History:  Procedure Laterality Date  . CHOLECYSTECTOMY    . KNEE ARTHROPLASTY  Bilateral 2015   meniscal repair   . LASIK    . TONSILECTOMY/ADENOIDECTOMY WITH MYRINGOTOMY     Social History   Social History Narrative  . Not on file    Objective: Vital Signs: There were no vitals taken for this visit.   Physical Exam   Musculoskeletal Exam: ***  CDAI Exam: No CDAI exam completed.   Investigation: No additional findings.  Imaging: Dg Inject Diag/thera/inc Needle/cath/plc Epi/cerv/thor W/img  Result Date: 09/04/2017 CLINICAL DATA:  Cervical spondylosis without myelopathy. RIGHT arm symptoms are the chief complaint today. Previously, the patient complained of LEFT arm pain. FLUOROSCOPY TIME:  12 seconds corresponding to a Dose Area Product of 7.34 Gy*m2 PROCEDURE: Informed consent was obtained on the first visit. Time-out was performed. CERVICAL EPIDURAL INJECTION An interlaminar approach was performed on the RIGHT at C7-T1. A 20 gauge epidural needle was advanced using loss-of-resistance technique. DIAGNOSTIC EPIDURAL INJECTION Injection of Isovue-M 300 shows a good epidural pattern with spread above and below the level of needle placement, primarily on the RIGHT. No vascular opacification is seen. THERAPEUTIC EPIDURAL INJECTION 1.5 ml of Kenalog 40 mixed with 1 ml of 1% Lidocaine and 2 ml of normal saline were then instilled. The procedure was well-tolerated, and the patient was discharged thirty minutes following the injection in good condition. IMPRESSION: Technically successful second epidural injection on the RIGHT at C7-T1. Electronically Signed   By: Staci Righter M.D.   On: 09/04/2017 10:28   Xr Hand 2 View Left  Result Date: 08/31/2017 CMC narrowing and spurring was noted.  No MCP narrowing  was noted.  Mild PIP and DIP narrowing was noted.  No intercarpal radiocarpal joint space narrowing was noted.  No erosive changes were noted. Impression: These findings are consistent with osteoarthritis of the hand.  Xr Hand 2 View Right  Result Date: 08/31/2017 [facility]  spurring was noted.  PIP and DIP narrowing was noted.  No MCP, intercarpal radiocarpal joint space narrowing was noted.  No erosive changes were noted. Impression: These findings are consistent with osteoarthritis of the hand.  Xr Knee 3 View Left  Result Date: 08/31/2017 Mild medial compartment narrowing was noted.  No chondrocalcinosis was noted.  Mild patellofemoral narrowing was noted. Impression: These findings are consistent with mild osteoarthritis and mild chondromalacia patella.  Xr Knee 3 View Right  Result Date: 08/31/2017 Mild medial compartment narrowing was noted.  No chondrocalcinosis was noted.  Mild patellofemoral narrowing was noted. Impression: These findings are consistent with mild osteoarthritis and mild chondromalacia patella.  Xr Lumbar Spine 2-3 Views  Result Date: 08/31/2017 Mild anterior spurring was noted.  Mild facet joint arthropathy was noted.  No significant disc space narrowing was noted.  No syndesmophytes were noted.  Mild levoscoliosis was noted.  Xr Pelvis 1-2 Views  Result Date: 08/31/2017 No SI joint to sclerosis or narrowing was noted. Impression: Unremarkable x-ray of the SI joints.   Recent Labs: Lab Results  Component Value Date   WBC 4.4 05/21/2017   HGB 15.1 05/21/2017   PLT 268 05/21/2017   NA 138 05/21/2017   K 4.4 05/21/2017   CL 106 05/21/2017   CO2 26 05/21/2017   GLUCOSE 80 05/21/2017   BUN 24 05/21/2017   CREATININE 0.97 05/21/2017   BILITOT 0.4 05/21/2017   AST 20 05/21/2017   ALT 28 05/21/2017   PROT 6.5 05/21/2017   PROT 6.6 05/21/2017   CALCIUM 9.6 05/21/2017   GFRAA 94 02/19/2017  SPEP normal, RF negative, anti-CCP negative, SSA negative, SSB negative, uric acid 6.3, ESR 2, CKD 2, HLA-B27 positive  Speciality Comments: No specialty comments available.  Procedures:  No procedures performed Allergies: Patient has no known allergies.   Assessment / Plan:     Visit Diagnoses: No diagnosis found.   Orders: No orders of  the defined types were placed in this encounter.  No orders of the defined types were placed in this encounter.   Face-to-face time spent with patient was *** minutes. Greater than 50% of time was spent in counseling and coordination of care.  Follow-Up Instructions: No follow-ups on file.   Bo Merino, MD  Note - This record has been created using Editor, commissioning.  Chart creation errors have been sought, but may not always  have been located. Such creation errors do not reflect on  the standard of medical care.

## 2017-10-01 ENCOUNTER — Ambulatory Visit (INDEPENDENT_AMBULATORY_CARE_PROVIDER_SITE_OTHER): Payer: Commercial Managed Care - PPO | Admitting: Osteopathic Medicine

## 2017-10-01 ENCOUNTER — Encounter: Payer: Self-pay | Admitting: Osteopathic Medicine

## 2017-10-01 VITALS — BP 134/84 | HR 67 | Temp 98.0°F | Wt 192.0 lb

## 2017-10-01 DIAGNOSIS — M170 Bilateral primary osteoarthritis of knee: Secondary | ICD-10-CM | POA: Diagnosis not present

## 2017-10-01 DIAGNOSIS — R21 Rash and other nonspecific skin eruption: Secondary | ICD-10-CM

## 2017-10-01 DIAGNOSIS — M2550 Pain in unspecified joint: Secondary | ICD-10-CM

## 2017-10-01 MED ORDER — DICLOFENAC SODIUM 1 % TD GEL: 2.0000 g | Freq: Four times a day (QID) | TRANSDERMAL | 11 refills | Status: AC

## 2017-10-01 MED ORDER — TRAMADOL HCL 50 MG PO TABS: 50.0000 mg | ORAL_TABLET | Freq: Three times a day (TID) | ORAL | 0 refills | Status: AC | PRN

## 2017-10-01 MED ORDER — TRIAMCINOLONE ACETONIDE 0.1 % EX CREA: 1.0000 "application " | TOPICAL_CREAM | Freq: Two times a day (BID) | CUTANEOUS | 2 refills | Status: AC

## 2017-10-01 MED ORDER — TRAMADOL HCL 50 MG PO TABS: 50.0000 mg | ORAL_TABLET | Freq: Three times a day (TID) | ORAL | 0 refills | Status: DC | PRN

## 2017-10-01 NOTE — Progress Notes (Signed)
HPI: Ryan Livingston is a 47 y.o. male who  has a past medical history of DDD (degenerative disc disease), cervical, Eczema, Heart murmur, Migraine, Nonrheumatic aortic valve insufficiency, and OA (osteoarthritis) of knee.  he presents to [facility] today, 10/01/17,  for chief complaint of:  Acute visit: c/o pain, rash   Pain: multiple joints worse in knees, elbows, wrists.  Works as a Psychologist, occupational, pain has been ongoing for several years but seems to be getting worse over the past year or so.  He has been following with this issue with sports medicine, Dr. Karie Schwalbe, and he was also recently seen by rheumatologist 08/31/17, referred by Dr T (last saw him in 05/2017). See chart for full details. Declined PT referral, CMC brace.  Injections have not been particularly helpful.  His knees are the worst of it, particularly with squatting or kneeling, which she has to do frequently as part of his job.  He previously had arthroscopic knee surgery, something to do with the meniscus, this was done in [place].  Records reviewed from Dr. Kathryne Hitch from 2017, arthroscopy February 2016 but not very helpful.  Rash: dry skin on feet. Similar complaint about a year ago.  Seems to be worse in the summer.  He states noticed significant improvement with the steroids been out of this medication.     Past medical history, surgical history, and family history reviewed.  Current medication list and allergy/intolerance information reviewed.   (See remainder of HPI, ROS, Phys Exam below)      ASSESSMENT/PLAN: The primary encounter diagnosis was Primary osteoarthritis of both knees. Diagnoses of Polyarthralgia and Rash and nonspecific skin eruption were also pertinent to this visit.   Failed multiple conservative treatments for knee pain, I think reasonable at this point to proceed to MRI and depending on results there consider follow-up with orthopedics.  He and I had a discussion about sparing  use of tramadol, to avoid dependence on this medicine, he has failed multiple NSAIDs.  We will also trial Voltaren gel.  Depending how things are looking with him over the next couple of months, may need to consider alternative employment    Meds ordered this encounter  Medications  . triamcinolone cream (KENALOG) 0.1 %    Sig: Apply 1 application topically 2 (two) times daily. To affected area(s) as needed    Dispense:  45 g    Refill:  2  . diclofenac sodium (VOLTAREN) 1 % GEL    Sig: Apply 2-4 g topically 4 (four) times daily. To affected joint.    Dispense:  100 g    Refill:  11  . DISCONTD: traMADol (ULTRAM) 50 MG tablet    Sig: Take 1-2 tablets (50-100 mg total) by mouth every 8 (eight) hours as needed for moderate pain. 1-2 tabs by mouth Q8 hours, maximum 6 tabs per day.    Dispense:  45 tablet    Refill:  0  . traMADol (ULTRAM) 50 MG tablet    Sig: Take 1-2 tablets (50-100 mg total) by mouth every 8 (eight) hours as needed for moderate pain.    Dispense:  45 tablet    Refill:  0      Follow-up plan: Return for review MRI results, recheck hand with Dr T in 2 weeks .          ############################################ ############################################ ############################################ ############################################  Past Surgical History:  Procedure Laterality Date  . CHOLECYSTECTOMY    . KNEE ARTHROPLASTY Bilateral 2015  meniscal repair   . LASIK    . TONSILECTOMY/ADENOIDECTOMY WITH MYRINGOTOMY       Outpatient Encounter Medications as of 10/01/2017  Medication Sig  . celecoxib (CELEBREX) 200 MG capsule One to 2 tablets by mouth daily as needed for pain. (Patient not taking: Reported on 08/31/2017)  . HYDROcodone-acetaminophen (NORCO/VICODIN) 5-325 MG tablet TAKE 1 TABLET BY MOUTH 4 TIMES DAILY AS NEEDED FOR SEVERE PAIN   No facility-administered encounter medications on file as of 10/01/2017.    No Known Allergies     Review of Systems:  Constitutional: No recent illness  HEENT: No  headache, no vision change  Cardiac: No  chest pain, No  pressure, No palpitations  Respiratory:  No  shortness of breath. No  Cough  Gastrointestinal: No  abdominal pain  Musculoskeletal: + myalgia/arthralgia  Skin: No  Rash  Hem/Onc: No  easy bruising/bleeding, No  abnormal lumps/bumps  Neurologic: No  weakness, No  Dizziness   Exam:  BP 134/84 (BP Location: Left Arm, Patient Position: Sitting, Cuff Size: Normal)   Pulse 67   Temp 98 F (36.7 C) (Oral)   Wt 192 lb (87.1 kg)   BMI 27.55 kg/m   Constitutional: VS see above. General Appearance: alert, well-developed, well-nourished, NAD  Eyes: Normal lids and conjunctive, non-icteric sclera  Ears, Nose, Mouth, Throat: MMM, Normal external inspection ears/nares/mouth/lips/gums.  Neck: No masses, trachea midline.   Respiratory: Normal respiratory effort.   Musculoskeletal: Gait normal.    Neurological: Normal balance/coordination. No tremor.  Skin: warm, dry, intact.   Psychiatric: Normal judgment/insight. Normal mood and affect. Oriented x3.   Visit summary with medication list and pertinent instructions was printed for patient to review, advised to alert us if any changes needed. All questions at time of visit were answered - patient instructed to contact office with any additional concerns. ER/RTC precautions were reviewed with the patient and understanding verbalized.   Follow-up plan: Return for review MRI results, recheck hand with Dr T in 2 weeks .    Please note: voice recognition software was used to produce this document, and typos may escape review. Please contact Dr. Lyn HollingsheadAlexander for any needed clarifications.

## 2017-10-07 ENCOUNTER — Ambulatory Visit: Payer: Self-pay | Admitting: Rheumatology

## 2017-10-12 ENCOUNTER — Ambulatory Visit (INDEPENDENT_AMBULATORY_CARE_PROVIDER_SITE_OTHER): Payer: Commercial Managed Care - PPO

## 2017-10-12 ENCOUNTER — Other Ambulatory Visit: Payer: Commercial Managed Care - PPO

## 2017-10-12 DIAGNOSIS — M170 Bilateral primary osteoarthritis of knee: Secondary | ICD-10-CM

## 2017-10-15 ENCOUNTER — Ambulatory Visit (INDEPENDENT_AMBULATORY_CARE_PROVIDER_SITE_OTHER): Payer: Commercial Managed Care - PPO | Admitting: Sports Medicine

## 2017-10-15 ENCOUNTER — Encounter: Payer: Self-pay | Admitting: Sports Medicine

## 2017-10-15 DIAGNOSIS — M170 Bilateral primary osteoarthritis of knee: Secondary | ICD-10-CM

## 2017-10-15 DIAGNOSIS — M5030 Other cervical disc degeneration, unspecified cervical region: Secondary | ICD-10-CM | POA: Diagnosis not present

## 2017-10-15 DIAGNOSIS — M1812 Unilateral primary osteoarthritis of first carpometacarpal joint, left hand: Secondary | ICD-10-CM

## 2017-10-15 MED ORDER — PREGABALIN 75 MG PO CAPS: 75.0000 mg | ORAL_CAPSULE | Freq: Two times a day (BID) | ORAL | 0 refills | Status: DC | PRN

## 2017-10-15 NOTE — Progress Notes (Signed)
Subjective:    CC: Multiple issues  HPI: Bilateral knee osteoarthritis: At this point we have tried everything nonoperative, NSAIDs, analgesics, activity modification, rehabilitation exercises, steroid injections, Visco supplementation.  MRIs did show arthritis as expected with expected bilateral meniscal tearing.  Pain is persistent.  No mechanical symptoms.  Left thumb CMC arthritis: Has failed injections, NSAIDs.  Agreeable to proceed with surgical consultation.  I reviewed the past medical history, family history, social history, surgical history, and allergies today and no changes were needed.  Please see the problem list section below in epic for further details.  Past Medical History: Past Medical History:  Diagnosis Date  . DDD (degenerative disc disease), cervical   . Eczema   . Heart murmur   . Migraine   . Nonrheumatic aortic valve insufficiency   . OA (osteoarthritis) of knee    both knees   Past Surgical History: Past Surgical History:  Procedure Laterality Date  . CHOLECYSTECTOMY    . KNEE ARTHROPLASTY Bilateral 2015   meniscal repair   . KNEE ARTHROSCOPY  2015   bilateral, done in [place]  . LASIK    . TONSILECTOMY/ADENOIDECTOMY WITH MYRINGOTOMY     Social History: Social History   Socioeconomic History  . Marital status: Married    Spouse name: Not on file  . Number of children: Not on file  . Years of education: Not on file  . Highest education level: Not on file  Occupational History  . Not on file  Social Needs  . Financial resource strain: Not on file  . Food insecurity:    Worry: Not on file    Inability: Not on file  . Transportation needs:    Medical: Not on file    Non-medical: Not on file  Tobacco Use  . Smoking status: Former Smoker    Last attempt to quit: 1989    Years since quitting: 30.6  . Smokeless tobacco: Never Used  Substance and Sexual Activity  . Alcohol use: No  . Drug use: No  . Sexual activity: Not Currently    Lifestyle  . Physical activity:    Days per week: Not on file    Minutes per session: Not on file  . Stress: Not on file  Relationships  . Social connections:    Talks on phone: Not on file    Gets together: Not on file    Attends religious service: Not on file    Active member of club or organization: Not on file    Attends meetings of clubs or organizations: Not on file    Relationship status: Not on file  Other Topics Concern  . Not on file  Social History Narrative  . Not on file   Family History: Family History  Problem Relation Age of Onset  . Diabetes Maternal Grandmother   . Stroke Maternal Grandfather   . HIV Father   . Healthy Son   . Healthy Son    Allergies: No Known Allergies Medications: See med rec.  Review of Systems: No fevers, chills, night sweats, weight loss, chest pain, or shortness of breath.   Objective:    General: Well Developed, well nourished, and in no acute distress.  Neuro: Alert and oriented x3, extra-ocular muscles intact, sensation grossly intact.  HEENT: Normocephalic, atraumatic, pupils equal round reactive to light, neck supple, no masses, no lymphadenopathy, thyroid nonpalpable.  Skin: Warm and dry, no rashes. Cardiac: Regular rate and rhythm, no murmurs rubs or gallops, no lower extremity  edema.  Respiratory: Clear to auscultation bilaterally. Not using accessory muscles, speaking in full sentences.  Impression and Recommendations:    Primary osteoarthritis of both knees Fortunately no improvement with NSAIDs, tramadol, hydrocodone, steroid injections or Visco supplementation. MRI did show bilateral knee osteoarthritis and degenerative meniscal tears. No mechanical symptoms, arthroscopy would probably not provide good long-lasting relief, knee arthroplasty is the next step. For now I would like him to consider Coolief radiofrequency ablation.   Osteoarthritis of carpometacarpal joint of left thumb At this point has not  responded to multiple interventions including medications and injections. Referral for thumb suspension.  DDD (degenerative disc disease), cervical Partial response to cervical epidurals, adding Lyrica.  I spent 25 minutes with this patient, greater than 50% was face-to-face time counseling regarding the above diagnoses ___________________________________________ Ihor Austin. Benjamin Stain, M.D., ABFM., CAQSM. Primary Care and Sports Medicine [facility]  Adjunct Instructor of Family Medicine  [employer]

## 2017-10-15 NOTE — Assessment & Plan Note (Signed)
Fortunately no improvement with NSAIDs, tramadol, hydrocodone, steroid injections or Visco supplementation. MRI did show bilateral knee osteoarthritis and degenerative meniscal tears. No mechanical symptoms, arthroscopy would probably not provide good long-lasting relief, knee arthroplasty is the next step. For now I would like him to consider Coolief radiofrequency ablation.

## 2017-10-15 NOTE — Assessment & Plan Note (Signed)
Partial response to cervical epidurals, adding Lyrica.

## 2017-10-15 NOTE — Assessment & Plan Note (Signed)
At this point has not responded to multiple interventions including medications and injections. Referral for thumb suspension.

## 2017-10-23 ENCOUNTER — Ambulatory Visit (INDEPENDENT_AMBULATORY_CARE_PROVIDER_SITE_OTHER): Payer: Commercial Managed Care - PPO | Admitting: Sports Medicine

## 2017-10-23 ENCOUNTER — Telehealth: Payer: Self-pay | Admitting: Osteopathic Medicine

## 2017-10-23 ENCOUNTER — Encounter: Payer: Self-pay | Admitting: Sports Medicine

## 2017-10-23 DIAGNOSIS — G43809 Other migraine, not intractable, without status migrainosus: Secondary | ICD-10-CM | POA: Diagnosis not present

## 2017-10-23 MED ORDER — TOPIRAMATE 50 MG PO TABS: ORAL_TABLET | ORAL | 3 refills | Status: DC

## 2017-10-23 MED ORDER — DEXAMETHASONE SODIUM PHOSPHATE 10 MG/ML IJ SOLN
10.0000 mg | Freq: Once | INTRAMUSCULAR | Status: AC
  Administered 2017-10-23: 10 mg via INTRAMUSCULAR

## 2017-10-23 MED ORDER — KETOROLAC TROMETHAMINE 30 MG/ML IJ SOLN
30.0000 mg | Freq: Once | INTRAMUSCULAR | Status: AC
  Administered 2017-10-23: 30 mg via INTRAMUSCULAR

## 2017-10-23 MED ORDER — RIZATRIPTAN BENZOATE 5 MG PO TBDP: 10.0000 mg | ORAL_TABLET | ORAL | 1 refills | Status: DC | PRN

## 2017-10-23 NOTE — Telephone Encounter (Signed)
Mr. Rodena MedinHodgin came by today. 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  He lives in [place] and he is going to be in [place] for another 2 hours and is hoping he can get this note today.

## 2017-10-23 NOTE — Telephone Encounter (Signed)
Took care of this in the office visit.

## 2017-10-23 NOTE — Progress Notes (Signed)
Subjective:    CC: Headache  HPI: This is a pleasant 47 year old male, he gets frequent headaches, almost daily but at least several times per month they are full-blown migraines, with throbbing, photophobia, phonophobia, nausea.  He denies any prior aura, no focal neurologic symptoms, no trauma or constitutional symptoms.  Headache is severe, persistent, not the worst headache of his life.  I reviewed the past medical history, family history, social history, surgical history, and allergies today and no changes were needed.  Please see the problem list section below in epic for further details.  Past Medical History: Past Medical History:  Diagnosis Date  . DDD (degenerative disc disease), cervical   . Eczema   . Heart murmur   . Migraine   . Nonrheumatic aortic valve insufficiency   . OA (osteoarthritis) of knee    both knees   Past Surgical History: Past Surgical History:  Procedure Laterality Date  . CHOLECYSTECTOMY    . KNEE ARTHROPLASTY Bilateral 2015   meniscal repair   . KNEE ARTHROSCOPY  2015   bilateral, done in [place]  . LASIK    . TONSILECTOMY/ADENOIDECTOMY WITH MYRINGOTOMY     Social History: Social History   Socioeconomic History  . Marital status: Married    Spouse name: Not on file  . Number of children: Not on file  . Years of education: Not on file  . Highest education level: Not on file  Occupational History  . Not on file  Social Needs  . Financial resource strain: Not on file  . Food insecurity:    Worry: Not on file    Inability: Not on file  . Transportation needs:    Medical: Not on file    Non-medical: Not on file  Tobacco Use  . Smoking status: Former Smoker    Last attempt to quit: 1989    Years since quitting: 30.6  . Smokeless tobacco: Never Used  Substance and Sexual Activity  . Alcohol use: No  . Drug use: No  . Sexual activity: Not Currently  Lifestyle  . Physical activity:    Days per week: Not on file    Minutes per  session: Not on file  . Stress: Not on file  Relationships  . Social connections:    Talks on phone: Not on file    Gets together: Not on file    Attends religious service: Not on file    Active member of club or organization: Not on file    Attends meetings of clubs or organizations: Not on file    Relationship status: Not on file  Other Topics Concern  . Not on file  Social History Narrative  . Not on file   Family History: Family History  Problem Relation Age of Onset  . Diabetes Maternal Grandmother   . Stroke Maternal Grandfather   . HIV Father   . Healthy Son   . Healthy Son    Allergies: No Known Allergies Medications: See med rec.  Review of Systems: No fevers, chills, night sweats, weight loss, chest pain, or shortness of breath.   Objective:    General: Well Developed, well nourished, and in no acute distress.  Neuro: Alert and oriented x3, extra-ocular muscles intact, sensation grossly intact.  Cranial nerves II through XII are intact, motor, sensory, coordinative functions are all intact. HEENT: Normocephalic, atraumatic, pupils equal round reactive to light, neck supple, no masses, no lymphadenopathy, thyroid nonpalpable.  Skin: Warm and dry, no rashes. Cardiac: Regular rate  and rhythm, no murmurs rubs or gallops, no lower extremity edema.  Respiratory: Clear to auscultation bilaterally. Not using accessory muscles, speaking in full sentences.  Toradol 30, Decadron 10 given intramuscular.  Impression and Recommendations:    Migraine headache Having acute migraine with photophobia, phonophobia, nausea. Toradol 30 intramuscular, Decadron 10. Zofran oral, he does have a drive home. Maxalt for migraine abortive treatment, he does have headaches almost every day he tells me, not quite to the severity, adding low-dose Topamax, he will keep a migraine/headache diary and follow-up with his PCP in a month for dose  titration.  ___________________________________________ Ihor Austinhomas J. Benjamin Stainhekkekandam, M.D., ABFM., CAQSM. Primary Care and Sports Medicine [facility]  Adjunct Instructor of Family Medicine  [employer]

## 2017-10-23 NOTE — Assessment & Plan Note (Signed)
Having acute migraine with photophobia, phonophobia, nausea. Toradol 30 intramuscular, Decadron 10. Zofran oral, he does have a drive home. Maxalt for migraine abortive treatment, he does have headaches almost every day he tells me, not quite to the severity, adding low-dose Topamax, he will keep a migraine/headache diary and follow-up with his PCP in a month for dose titration.

## 2017-11-12 ENCOUNTER — Ambulatory Visit: Payer: Commercial Managed Care - PPO | Admitting: Sports Medicine

## 2017-12-18 ENCOUNTER — Other Ambulatory Visit: Payer: Self-pay | Admitting: Sports Medicine

## 2017-12-18 DIAGNOSIS — G43809 Other migraine, not intractable, without status migrainosus: Secondary | ICD-10-CM

## 2017-12-25 DIAGNOSIS — Z9889 Other specified postprocedural states: Secondary | ICD-10-CM

## 2017-12-25 HISTORY — DX: Other specified postprocedural states: Z98.890

## 2018-02-19 ENCOUNTER — Ambulatory Visit (INDEPENDENT_AMBULATORY_CARE_PROVIDER_SITE_OTHER): Payer: Commercial Managed Care - PPO | Admitting: Osteopathic Medicine

## 2018-02-19 ENCOUNTER — Encounter: Payer: Self-pay | Admitting: Osteopathic Medicine

## 2018-02-19 VITALS — BP 123/82 | HR 75 | Wt 204.9 lb

## 2018-02-19 DIAGNOSIS — G43809 Other migraine, not intractable, without status migrainosus: Secondary | ICD-10-CM

## 2018-02-19 DIAGNOSIS — Z0000 Encounter for general adult medical examination without abnormal findings: Secondary | ICD-10-CM

## 2018-02-19 MED ORDER — TOPIRAMATE 50 MG PO TABS: ORAL_TABLET | ORAL | 3 refills | Status: AC

## 2018-02-19 MED ORDER — RIZATRIPTAN BENZOATE 5 MG PO TBDP: 5.0000 mg | ORAL_TABLET | ORAL | 3 refills | Status: AC | PRN

## 2018-02-19 NOTE — Patient Instructions (Addendum)
Call/message me in a month to update me on insurance and headaches - if topamax not helping, we can see about changing medicines  General Preventive Care  Most recent routine screening lipids/other labs: ordered today.    Everyone should have blood pressure checked once per year.   Tobacco: don't!   Alcohol: responsible moderation is ok for most adults.   Exercise: as tolerated to reduce risk of cardiovascular disease and diabetes. Strength training will also prevent osteoporosis. Keep up the exercise as you're able!   Mental health: if need for mental health care (medicines, counseling, other), or concerns about moods, please let me know!   Sexual health: if need for STD testing, or if concerns with libido/pain problems, please let me know!   Advanced Directive: Living Will and/or Healthcare Power of Attorney recommended for all adults, regardless of age or health. See printed information.  Vaccines  Flu vaccine: recommended for almost everyone, every fall.   Shingles vaccine: Shingrix recommended after age 48.  Pneumonia vaccines: Prevnar and Pneumovax recommended after age 48, or sooner if certain medical conditions.  Tetanus booster: Tdap recommended every 10 years. Reported done 2017 but we do not have official report.  Cancer screenings   Colon cancer screening: recommended for everyone at age 48, but some folks need a colonoscopy sooner if risk factors or family history   Prostate cancer screening: PSA blood test around age 48  Lung cancer screening: not needed since you quit smoking so long ago!  Infection screenings . HIV, Gonorrhea/Chlamydia: screening as needed. . Hepatitis C: recommended for anyone born [id number] - not needed . TB: certain at-risk populations, or depending on work requirements and/or travel history Other . Bone Density Test: recommended for men at age 48 . Abdominal Aortic Aneurysm: screening with ultrasound recommended once for men age 48-75 who  have ever smoked

## 2018-02-19 NOTE — Progress Notes (Signed)
HPI: Ryan Livingston is a 47 y.o. male who  has a past medical history of DDD (degenerative disc disease), cervical, Eczema, Heart murmur, Migraine, Nonrheumatic aortic valve insufficiency, and OA (osteoarthritis) of knee.  [facility] today, 02/19/18,  for chief complaint of: Annual Physical     Patient here for annual physical / wellness exam.  See preventive care reviewed as below.  Recent labs reviewed in detail with the patient.   Additional concerns today include:   Headaches - needs refills. Daily mild headache for years, ran out of topamax but wasn't on this very long   May be losing insurance next year         Past medical, surgical, social and family history reviewed:  Patient Active Problem List   Diagnosis Date Noted  . Primary osteoarthritis of both hands 09/25/2017  . DDD (degenerative disc disease), lumbar 09/25/2017  . Polyarthralgia 05/21/2017  . Malaise 01/19/2017  . Cough present for greater than 3 weeks 01/19/2017  . Night sweats 01/19/2017  . Colicky LUQ abdominal pain 08/05/2016  . Migraine headache 08/05/2016  . Eczema 08/05/2016  . DDD (degenerative disc disease), cervical 01/30/2016  . Bilateral foot pain 01/30/2016  . Annual physical exam 11/01/2015  . Primary osteoarthritis of both knees 10/25/2015  . Osteoarthritis of carpometacarpal joint of left thumb 10/25/2015    Past Surgical History:  Procedure Laterality Date  . CHOLECYSTECTOMY    . KNEE ARTHROPLASTY Bilateral 2015   meniscal repair   . KNEE ARTHROSCOPY  2015   bilateral, done in [place]  . LASIK    . TONSILECTOMY/ADENOIDECTOMY WITH MYRINGOTOMY      Social History   Tobacco Use  . Smoking status: Former Smoker    Last attempt to quit: 1989    Years since quitting: 31.0  . Smokeless tobacco: Never Used  Substance Use Topics  . Alcohol use: No    Family History  Problem Relation Age of Onset  . Diabetes Maternal  Grandmother   . Stroke Maternal Grandfather   . HIV Father   . Healthy Son   . Healthy Son      Current medication list and allergy/intolerance information reviewed:    Current Outpatient Medications  Medication Sig Dispense Refill  . diclofenac sodium (VOLTAREN) 1 % GEL Apply 2-4 g topically 4 (four) times daily. To affected joint. 100 g 11  . Omega-3 Fatty Acids (FISH OIL PO) Take by mouth.    . pregabalin (LYRICA) 75 MG capsule Take 1 capsule (75 mg total) by mouth 2 (two) times daily as needed. 28 capsule 0  . rizatriptan (MAXALT-MLT) 5 MG disintegrating tablet TAKE 2 TABLETS AS NEEDED FOR FOR MIGRAINE, MAY REPEAT IN 2 HOURS IF NEEDED 10 tablet 1  . topiramate (TOPAMAX) 50 MG tablet One half tab by mouth daily for one week then one tab by mouth daily. 30 tablet 3  . traMADol (ULTRAM) 50 MG tablet Take 1-2 tablets (50-100 mg total) by mouth every 8 (eight) hours as needed for moderate pain. 45 tablet 0  . triamcinolone cream (KENALOG) 0.1 % Apply 1 application topically 2 (two) times daily. To affected area(s) as needed 45 g 2   No current facility-administered medications for this visit.     No Known Allergies    Review of Systems:  Constitutional:  No  fever, no chills, No recent illness, No unintentional weight changes. No significant fatigue.   HEENT: +headache, no vision change, no hearing  change, No sore throat, No  sinus pressure  Cardiac: No  chest pain, No  pressure, No palpitations, No  Orthopnea  Respiratory:  No  shortness of breath. No  Cough  Gastrointestinal: No  abdominal pain, No  nausea, No  vomiting,  No  blood in stool, No  diarrhea, No  constipation   Musculoskeletal: No new myalgia/arthralgia  Skin: No  Rash, No other wounds/concerning lesions  Genitourinary: No  incontinence, No  abnormal genital bleeding, No abnormal genital discharge  Hem/Onc: No  easy bruising/bleeding, No  abnormal lymph node  Endocrine: No cold intolerance,  No heat  intolerance. No polyuria/polydipsia/polyphagia   Neurologic: No  weakness, No  dizziness, No  slurred speech/focal weakness/facial droop  Psychiatric: No  concerns with depression, No  concerns with anxiety, No sleep problems, No mood problems  Exam:  BP 123/82 (BP Location: Left Arm, Patient Position: Sitting, Cuff Size: Normal)   Pulse 75   Wt 204 lb 14.4 oz (92.9 kg)   BMI 29.40 kg/m   Constitutional: VS see above. General Appearance: alert, well-developed, well-nourished, NAD  Eyes: Normal lids and conjunctive, non-icteric sclera  Ears, Nose, Mouth, Throat: MMM, Normal external inspection ears/nares/mouth/lips/gums. TM normal bilaterally. Pharynx/tonsils no erythema, no exudate. Nasal mucosa normal.   Neck: No masses, trachea midline. No thyroid enlargement. No tenderness/mass appreciated. No lymphadenopathy  Respiratory: Normal respiratory effort. no wheeze, no rhonchi, no rales  Cardiovascular: S1/S2 normal, no murmur, no rub/gallop auscultated. RRR. No lower extremity edema.   Gastrointestinal: Nontender, no masses. No hepatomegaly, no splenomegaly. No hernia appreciated. Bowel sounds normal. Rectal exam deferred.   Musculoskeletal: Gait normal. No clubbing/cyanosis of digits.   Neurological: Normal balance/coordination. No tremor. No cranial nerve deficit on limited exam. Motor and sensation intact and symmetric. Cerebellar reflexes intact.   Skin: warm, dry, intact. No rash/ulcer. No concerning nevi or subq nodules on limited exam.    Psychiatric: Normal judgment/insight. Normal mood and affect. Oriented x3.      Immunization History  Administered Date(s) Administered  . Td 02/25/2015       ASSESSMENT/PLAN: The primary encounter diagnosis was Annual physical exam. A diagnosis of Other migraine without status migrainosus, not intractable was also pertinent to this visit.   Orders Placed This Encounter  Procedures  . CBC  . COMPLETE METABOLIC PANEL WITH GFR   . Lipid panel    Meds ordered this encounter  Medications  . topiramate (TOPAMAX) 50 MG tablet    Sig: One half tab by mouth daily for one week then one tab by mouth daily.    Dispense:  90 tablet    Refill:  3  . rizatriptan (MAXALT-MLT) 5 MG disintegrating tablet    Sig: Take 1-2 tablets (5-10 mg total) by mouth as needed for migraine. May repeat in 2 hours if needed    Dispense:  10 tablet    Refill:  3    This prescription was filled on 12/03/2017. Any refills authorized will be placed on file.    Patient Instructions  Call/message me in a month to update me on insurance and headaches - if topamax not helping, we can see about changing medicines  General Preventive Care  Most recent routine screening lipids/other labs: ordered today.    Everyone should have blood pressure checked once per year.   Tobacco: don't!   Alcohol: responsible moderation is ok for most adults.   Exercise: as tolerated to reduce risk of cardiovascular disease and diabetes. Strength training will also prevent  osteoporosis. Keep up the exercise as you're able!   Mental health: if need for mental health care (medicines, counseling, other), or concerns about moods, please let me know!   Sexual health: if need for STD testing, or if concerns with libido/pain problems, please let me know!   Advanced Directive: Living Will and/or Healthcare Power of Attorney recommended for all adults, regardless of age or health. See printed information.  Vaccines  Flu vaccine: recommended for almost everyone, every fall.   Shingles vaccine: Shingrix recommended after age 950.  Pneumonia vaccines: Prevnar and Pneumovax recommended after age 48, or sooner if certain medical conditions.  Tetanus booster: Tdap recommended every 10 years. Reported done 2017 but we do not have official report.  Cancer screenings   Colon cancer screening: recommended for everyone at age 48, but some folks need a colonoscopy sooner if  risk factors or family history   Prostate cancer screening: PSA blood test around age 48  Lung cancer screening: not needed since you quit smoking so long ago!  Infection screenings . HIV, Gonorrhea/Chlamydia: screening as needed. . Hepatitis C: recommended for anyone born [id number] - not needed . TB: certain at-risk populations, or depending on work requirements and/or travel history Other . Bone Density Test: recommended for men at age 48 . Abdominal Aortic Aneurysm: screening with ultrasound recommended once for men age 48-75 who have ever smoked         Visit summary with medication list and pertinent instructions was printed for patient to review. All questions at time of visit were answered - patient instructed to contact office with any additional concerns or updates. ER/RTC precautions were reviewed with the patient.    Please note: voice recognition software was used to produce this document, and typos may escape review. Please contact Dr. Lyn HollingsheadAlexander for any needed clarifications.     Follow-up plan: Return in about 1 year (around 02/20/2019) for annual physical, sooner if needed.

## 2018-02-23 LAB — COMPLETE METABOLIC PANEL WITH GFR
AG Ratio: 2 (calc) (ref 1.0–2.5)
ALT: 41 U/L (ref 9–46)
AST: 21 U/L (ref 10–40)
Albumin: 4.6 g/dL (ref 3.6–5.1)
Alkaline phosphatase (APISO): 83 U/L (ref 40–115)
BILIRUBIN TOTAL: 0.3 mg/dL (ref 0.2–1.2)
BUN: 18 mg/dL (ref 7–25)
CHLORIDE: 105 mmol/L (ref 98–110)
CO2: 26 mmol/L (ref 20–32)
Calcium: 9.9 mg/dL (ref 8.6–10.3)
Creat: 1.06 mg/dL (ref 0.60–1.35)
GFR, Est African American: 96 mL/min/{1.73_m2} (ref >=60)
GFR, Est Non African American: 83 mL/min/{1.73_m2} (ref >=60)
GLUCOSE: 111 mg/dL (ref 65–139)
Globulin: 2.3 g/dL (calc) (ref 1.9–3.7)
POTASSIUM: 4.4 mmol/L (ref 3.5–5.3)
Sodium: 139 mmol/L (ref 135–146)
Total Protein: 6.9 g/dL (ref 6.1–8.1)

## 2018-02-23 LAB — CBC
HCT: 45.2 % (ref 38.5–50.0)
Hemoglobin: 15.6 g/dL (ref 13.2–17.1)
MCH: 31 pg (ref 27.0–33.0)
MCHC: 34.5 g/dL (ref 32.0–36.0)
MCV: 89.7 fL (ref 80.0–100.0)
MPV: 9.9 fL (ref 7.5–12.5)
PLATELETS: 285 10*3/uL (ref 140–400)
RBC: 5.04 10*6/uL (ref 4.20–5.80)
RDW: 12.1 % (ref 11.0–15.0)
WBC: 6.6 10*3/uL (ref 3.8–10.8)

## 2018-02-23 LAB — TEST AUTHORIZATION

## 2018-02-23 LAB — LIPID PANEL
Cholesterol: 200 mg/dL — ABNORMAL HIGH (ref <200)
HDL: 36 mg/dL — ABNORMAL LOW (ref >40)
LDL CHOLESTEROL (CALC): 120 mg/dL — AB
Non-HDL Cholesterol (Calc): 164 mg/dL (calc) — ABNORMAL HIGH (ref <130)
Total CHOL/HDL Ratio: 5.6 (calc) — ABNORMAL HIGH (ref <5.0)
Triglycerides: 311 mg/dL — ABNORMAL HIGH (ref <150)

## 2018-02-23 LAB — HEMOGLOBIN A1C W/OUT EAG: HEMOGLOBIN A1C: 5.3 %{Hb} (ref <5.7)

## 2018-06-10 ENCOUNTER — Encounter: Payer: Self-pay | Admitting: Osteopathic Medicine

## 2018-11-12 DIAGNOSIS — G43709 Chronic migraine without aura, not intractable, without status migrainosus: Secondary | ICD-10-CM | POA: Insufficient documentation

## 2018-12-26 ENCOUNTER — Emergency Department
Admission: EM | Admit: 2018-12-26 | Discharge: 2018-12-26 | Disposition: A | Discharge status: Home / Self Care | Payer: Commercial Managed Care - PPO | Source: Home / Self Care

## 2018-12-26 ENCOUNTER — Other Ambulatory Visit: Payer: Self-pay

## 2018-12-26 DIAGNOSIS — F419 Anxiety disorder, unspecified: Secondary | ICD-10-CM

## 2018-12-26 DIAGNOSIS — F439 Reaction to severe stress, unspecified: Secondary | ICD-10-CM

## 2018-12-26 DIAGNOSIS — R634 Abnormal weight loss: Secondary | ICD-10-CM

## 2018-12-26 DIAGNOSIS — R630 Anorexia: Secondary | ICD-10-CM

## 2018-12-26 MED ORDER — HYDROXYZINE HCL 25 MG PO TABS: 50.0000 mg | ORAL_TABLET | Freq: Four times a day (QID) | ORAL | 0 refills | Status: AC | PRN

## 2018-12-26 NOTE — Discharge Instructions (Signed)
°  Atarax (hydroxizine) is an antihistamine that can be taken to help with stress and anxiety. This medication can cause drowsiness so do not drive or drink alcohol while taking.  Do not take more than prescribed. Do not take with other sedating medications.  Please call Dr. Redgie Grayer office tomorrow morning or you can send a MyChart message requesting an appointment for further evaluation and treatment of your symptoms.  Call 911 or go to the hospital if symptoms worsen, or use the resource guide with CRISIS hotline if you feel you need to speak with someone immediately.

## 2018-12-26 NOTE — ED Provider Notes (Signed)
[facility]    CSN: [id number] Arrival date & time: 12/26/18  1123      History   Chief Complaint Chief Complaint  Patient presents with  . Abdominal Pain  . Weight Loss    HPI Ryan Livingston is a 48 y.o. male.   HPI  Ryan Livingston is a 48 y.o. male presenting to UC with c/o 2-3 months of worsening stress at work and home.  Stress and anxiety has worsened more over the last 3 weeks with associated loss of appetite, unintended weight loss of about 30 pounds in that time due to loss of appetite.  He has abdominal cramping and nausea but no vomiting or diarrhea. He feels like he cannot stop pacing, and his mind is always focused on problems.  He reports going to the hospital a few weeks ago and had blood work due to malnutrition.  He denies thoughts of hurting himself or others.  He does report calling his insurance company and was offered 6 free mental health sessions, which he plans to schedule soon.  He does have a PCP, Dr. Lyn HollingsheadAlexander, but he has not contacted her about his current feelings and symptoms.  Pt reports calling out of work on Thursday and Friday (10/29 and 10/30) due to feeling too nauseated. Pt is requesting a work note today.   Past Medical History:  Diagnosis Date  . DDD (degenerative disc disease), cervical   . Eczema   . Heart murmur   . History of hand surgery 12/2017  . Migraine   . Nonrheumatic aortic valve insufficiency   . OA (osteoarthritis) of knee    both knees    Patient Active Problem List   Diagnosis Date Noted  . Primary osteoarthritis of both hands 09/25/2017  . DDD (degenerative disc disease), lumbar 09/25/2017  . Polyarthralgia 05/21/2017  . Malaise 01/19/2017  . Cough present for greater than 3 weeks 01/19/2017  . Night sweats 01/19/2017  . Colicky LUQ abdominal pain 08/05/2016  . Migraine headache 08/05/2016  . Eczema 08/05/2016  . DDD (degenerative disc disease), cervical 01/30/2016  . Bilateral foot pain 01/30/2016  .  Annual physical exam 11/01/2015  . Primary osteoarthritis of both knees 10/25/2015  . Osteoarthritis of carpometacarpal joint of left thumb 10/25/2015    Past Surgical History:  Procedure Laterality Date  . CHOLECYSTECTOMY    . KNEE ARTHROPLASTY Bilateral 2015   meniscal repair   . KNEE ARTHROSCOPY  2015   bilateral, done in [place]  . LASIK    . TONSILECTOMY/ADENOIDECTOMY WITH MYRINGOTOMY         Home Medications    Prior to Admission medications   Medication Sig Start Date End Date Taking? Authorizing Provider  diclofenac sodium (VOLTAREN) 1 % GEL Apply 2-4 g topically 4 (four) times daily. To affected joint. 10/01/17   Sunnie NielsenAlexander, Natalie, DO  hydrOXYzine (ATARAX/VISTARIL) 25 MG tablet Take 2 tablets (50 mg total) by mouth every 6 (six) hours as needed for anxiety. 12/26/18   Lurene ShadowPhelps, Aaleigha Bozza O, PA-C  Omega-3 Fatty Acids (FISH OIL PO) Take by mouth.    [provider]  rizatriptan (MAXALT-MLT) 5 MG disintegrating tablet Take 1-2 tablets (5-10 mg total) by mouth as needed for migraine. May repeat in 2 hours if needed 02/19/18   Sunnie NielsenAlexander, Natalie, DO  topiramate (TOPAMAX) 50 MG tablet One half tab by mouth daily for one week then one tab by mouth daily. 02/19/18   Sunnie NielsenAlexander, Natalie, DO  traMADol (ULTRAM) 50 MG tablet Take  1-2 tablets (50-100 mg total) by mouth every 8 (eight) hours as needed for moderate pain. Patient not taking: Reported on 02/19/2018 10/01/17   Emeterio Reeve, DO  triamcinolone cream (KENALOG) 0.1 % Apply 1 application topically 2 (two) times daily. To affected area(s) as needed 10/01/17   Emeterio Reeve, DO    Family History Family History  Problem Relation Age of Onset  . Diabetes Maternal Grandmother   . Stroke Maternal Grandfather   . HIV Father   . Healthy Son   . Healthy Son     Social History Social History   Tobacco Use  . Smoking status: Former Smoker    Quit date: 1989    Years since quitting: 31.8  . Smokeless tobacco: Never  Used  Substance Use Topics  . Alcohol use: No  . Drug use: No     Allergies   Patient has no known allergies.   Review of Systems Review of Systems  Constitutional: Positive for appetite change, fatigue and unexpected weight change. Negative for chills and fever.  HENT: Negative for congestion, ear pain, sore throat, trouble swallowing and voice change.   Respiratory: Negative for cough and shortness of breath.   Cardiovascular: Negative for chest pain and palpitations.  Gastrointestinal: Positive for abdominal pain and nausea. Negative for diarrhea and vomiting.  Musculoskeletal: Negative for arthralgias, back pain and myalgias.  Skin: Negative for rash.  Neurological: Negative for dizziness, light-headedness and headaches.  Psychiatric/Behavioral: Positive for sleep disturbance. Negative for hallucinations, self-injury and suicidal ideas. The patient is nervous/anxious.      Physical Exam Triage Vital Signs ED Triage Vitals  Enc Vitals Group     BP 12/26/18 1140 122/81     Pulse Rate 12/26/18 1140 80     Resp 12/26/18 1140 20     Temp 12/26/18 1140 98.6 F (37 C)     Temp Source 12/26/18 1140 Oral     SpO2 12/26/18 1140 97 %     Weight 12/26/18 1141 176 lb (79.8 kg)     Height 12/26/18 1141 5\' 10"  (1.778 m)     Head Circumference --      Peak Flow --      Pain Score 12/26/18 1141 0     Pain Loc --      Pain Edu? --      Excl. in [place]? --    No data found.  Updated Vital Signs BP 122/81 (BP Location: Right Arm)   Pulse 80   Temp 98.6 F (37 C) (Oral)   Resp 20   Ht 5\' 10"  (1.778 m)   Wt 176 lb (79.8 kg)   SpO2 97%   BMI 25.25 kg/m   Visual Acuity Right Eye Distance:   Left Eye Distance:   Bilateral Distance:    Right Eye Near:   Left Eye Near:    Bilateral Near:     Physical Exam Vitals signs and nursing note reviewed.  Constitutional:      Appearance: He is well-developed.     Comments: Pt appears anxious, pacing in the room. Appears anxious but  is alert and cooperative during exam.  HENT:     Head: Normocephalic and atraumatic.  Neck:     Musculoskeletal: Normal range of motion.  Cardiovascular:     Rate and Rhythm: Normal rate and regular rhythm.  Pulmonary:     Effort: Pulmonary effort is normal. No respiratory distress.     Breath sounds: Normal breath sounds.  Abdominal:  General: There is no distension.     Palpations: Abdomen is soft.     Tenderness: There is no abdominal tenderness.  Musculoskeletal: Normal range of motion.  Skin:    General: Skin is warm and dry.  Neurological:     Mental Status: He is alert and oriented to person, place, and time.  Psychiatric:        Attention and Perception: Attention and perception normal.        Mood and Affect: Mood is anxious.        Speech: Speech normal.        Behavior: Behavior normal. Behavior is not agitated, slowed, aggressive, withdrawn, hyperactive or combative. Behavior is cooperative.        Thought Content: Thought content is not paranoid or delusional. Thought content does not include homicidal or suicidal ideation.      UC Treatments / Results  Labs (all labs ordered are listed, but only abnormal results are displayed) Labs Reviewed - No data to display  EKG   Radiology No results found.  Procedures Procedures (including critical care time)  Medications Ordered in UC Medications - No data to display  Initial Impression / Assessment and Plan / UC Course  I have reviewed the triage vital signs and the nursing notes.  Pertinent labs & imaging results that were available during my care of the patient were reviewed by me and considered in my medical decision making (see chart for details).     Pt appears mildly anxious, otherwise normal exam Advised pt he needs further evaluation and treatment through his PCP for anxiety, but can offer short course of atarax to help with anxiety and sleep. Offered to check TSH and basic labs, pt declined  stating he was just in the ED and had labs completed.  Work note provided for today and tomorrow. Pt requested note from last week, unfortunately advised pt we could not back date the note since we only saw him today.    Pt denies SI or HI Pt does not appear to be a danger to himself or others at this time. He is safe for discharge home.  AVS with resource guide provided for [employer]. Encouraged to f/u with his insurance for the 6 BH sessions offered the other day.  Final Clinical Impressions(s) / UC Diagnoses   Final diagnoses:  Stress  Loss of appetite for more than 2 weeks  Weight loss, unintentional  Anxiety     Discharge Instructions      Atarax (hydroxizine) is an antihistamine that can be taken to help with stress and anxiety. This medication can cause drowsiness so do not drive or drink alcohol while taking.  Do not take more than prescribed. Do not take with other sedating medications.  Please call Dr. Mardelle Matte office tomorrow morning or you can send a MyChart message requesting an appointment for further evaluation and treatment of your symptoms.  Call 911 or go to the hospital if symptoms worsen, or use the resource guide with CRISIS hotline if you feel you need to speak with someone immediately.      ED Prescriptions    Medication Sig Dispense Auth. Provider   hydrOXYzine (ATARAX/VISTARIL) 25 MG tablet Take 2 tablets (50 mg total) by mouth every 6 (six) hours as needed for anxiety. 12 tablet Lurene Shadow, [place]     I have reviewed the PDMP during this encounter.   Lurene Shadow, PA-C 12/26/18 1345

## 2018-12-26 NOTE — ED Triage Notes (Signed)
Pt states that he is under a lot of stress for the last 2-3 months.  Much worse over the last 3 weeks,  Has abdominal pain, poor sleep, nauseated, weight loss, pacing and mind is always focused on the problems.  Can not relax.

## 2018-12-27 ENCOUNTER — Encounter: Payer: Self-pay | Admitting: Osteopathic Medicine

## 2018-12-27 ENCOUNTER — Ambulatory Visit (INDEPENDENT_AMBULATORY_CARE_PROVIDER_SITE_OTHER): Payer: Managed Care, Other (non HMO) | Admitting: Osteopathic Medicine

## 2018-12-27 VITALS — BP 133/79 | HR 80 | Temp 98.2°F | Wt 179.0 lb

## 2018-12-27 DIAGNOSIS — F411 Generalized anxiety disorder: Secondary | ICD-10-CM | POA: Diagnosis not present

## 2018-12-27 DIAGNOSIS — F99 Mental disorder, not otherwise specified: Secondary | ICD-10-CM

## 2018-12-27 DIAGNOSIS — F5105 Insomnia due to other mental disorder: Secondary | ICD-10-CM | POA: Diagnosis not present

## 2018-12-27 MED ORDER — ESCITALOPRAM OXALATE 10 MG PO TABS: 10.0000 mg | ORAL_TABLET | Freq: Every day | ORAL | 1 refills | Status: AC

## 2018-12-27 MED ORDER — ZOLPIDEM TARTRATE 5 MG PO TABS: 5.0000 mg | ORAL_TABLET | Freq: Every evening | ORAL | 1 refills | Status: AC | PRN

## 2018-12-27 NOTE — Progress Notes (Signed)
HPI: Ryan Livingston is a 48 y.o. male who  has a past medical history of DDD (degenerative disc disease), cervical, Eczema, Heart murmur, History of hand surgery (12/2017), Migraine, Nonrheumatic aortic valve insufficiency, and OA (osteoarthritis) of knee.  he presents to [facility] today, 12/27/18,  for chief complaint of:  Anxiety, insomnia  . Context: increased stress both at work/home, particularly d/t wife's alcoholism. Recent UC and ER visits, no concerns on labs . Quality: on edge, racing thoughts, unable to sleep   . Duration: 3-4 weeks worse, but ongoing several months  . Assoc signs/symptoms: insomnia, nausea, loss of appetite w/ associated 30 lb weight loss . Participating in counseling  . Patient reports he DOES feel safe at home      At today's visit 12/27/18 ... PMH, PSH, FH reviewed and updated as needed.  Current medication list and allergy/intolerance hx reviewed and updated as needed. (See remainder of HPI, ROS, Phys Exam below)   No results found.  No results found for this or any previous visit (from the past 72 hour(s)).        ASSESSMENT/PLAN: The primary encounter diagnosis was Anxiety state. A diagnosis of Insomnia due to other mental disorder was also pertinent to this visit.      Meds ordered this encounter  Medications  . escitalopram (LEXAPRO) 10 MG tablet    Sig: Take 1 tablet (10 mg total) by mouth at bedtime. Take 0.5 tablet (5 mg total) po qhs for first week then increase to 10 mg    Dispense:  90 tablet    Refill:  1  . zolpidem (AMBIEN) 5 MG tablet    Sig: Take 1 tablet (5 mg total) by mouth at bedtime as needed for sleep.    Dispense:  15 tablet    Refill:  1       Follow-up plan: Return in about 1 week (around 01/03/2019) for recheck anxiety, insomnia w/ Dr Sheppard Coil - call sooner if needed  .                                                 ################################################# ################################################# ################################################# #################################################    Current Meds  Medication Sig  . diclofenac sodium (VOLTAREN) 1 % GEL Apply 2-4 g topically 4 (four) times daily. To affected joint.  . hydrOXYzine (ATARAX/VISTARIL) 25 MG tablet Take 2 tablets (50 mg total) by mouth every 6 (six) hours as needed for anxiety.  . Omega-3 Fatty Acids (FISH OIL PO) Take by mouth.  . rizatriptan (MAXALT-MLT) 5 MG disintegrating tablet Take 1-2 tablets (5-10 mg total) by mouth as needed for migraine. May repeat in 2 hours if needed  . topiramate (TOPAMAX) 50 MG tablet One half tab by mouth daily for one week then one tab by mouth daily.  Marland Kitchen triamcinolone cream (KENALOG) 0.1 % Apply 1 application topically 2 (two) times daily. To affected area(s) as needed    No Known Allergies     Review of Systems:  Constitutional: No recent illness  Cardiac: No  chest pain, No  pressure, No palpitations  Respiratory:  No  shortness of breath. No  Cough  Gastrointestinal: No  abdominal pain, no change on bowel habits, +nausea, +appetite change  Neurologic: No  weakness, No  Dizziness  Psychiatric: +concerns with depression, +concerns with anxiety  Depression screen  [facility] 2/9 12/27/2018 10/01/2017 02/19/2017  Decreased Interest 1 0 0  Down, Depressed, Hopeless 2 0 0  PHQ - 2 Score 3 0 0  Altered sleeping 3 0 -  Tired, decreased energy 0 0 -  Change in appetite 3 0 -  Feeling bad or failure about yourself  1 0 -  Trouble concentrating 1 0 -  Moving slowly or fidgety/restless 0 0 -  Suicidal thoughts 0 0 -  PHQ-9 Score 11 0 -  Difficult doing work/chores Somewhat difficult - -   GAD 7 : Generalized Anxiety Score 12/27/2018 10/01/2017  Nervous, Anxious, on Edge 3 0   Control/stop worrying 3 0  Worry too much - different things 3 0  Trouble relaxing 3 0  Restless 3 0  Easily annoyed or irritable 1 1  Afraid - awful might happen 2 0  Total GAD 7 Score 18 1  Anxiety Difficulty - Not difficult at all     Exam:  BP 133/79 (BP Location: Left Arm, Patient Position: Sitting, Cuff Size: Normal)   Pulse 80   Temp 98.2 F (36.8 C) (Oral)   Wt 179 lb 0.6 oz (81.2 kg)   BMI 25.69 kg/m   Constitutional: VS see above. General Appearance: alert, well-developed, well-nourished, NAD  Eyes: Normal lids and conjunctive, non-icteric sclera  Ears, Nose, Mouth, Throat: MMM, Normal external inspection ears/nares/mouth/lips/gums.  Neck: No masses, trachea midline.   Respiratory: Normal respiratory effort.  Musculoskeletal: Gait normal. Symmetric and independent movement of all extremities  Abdominal: non-tender, non-distended, no appreciable organomegaly, neg Murphy's, BS WNLx4  Neurological: Normal balance/coordination. No tremor.  Skin: warm, dry, intact.   Psychiatric: Normal judgment/insight. Normal mood and affect. Oriented x3.       Visit summary with medication list and pertinent instructions was printed for patient to review, patient was advised to alert [place] if any updates are needed. All questions at time of visit were answered - patient instructed to contact office with any additional concerns. ER/RTC precautions were reviewed with the patient and understanding verbalized.   Note: Total time spent 25 minutes, greater than 50% of the visit was spent face-to-face counseling and coordinating care for the following: The primary encounter diagnosis was Anxiety state. A diagnosis of Insomnia due to other mental disorder was also pertinent to this visit.Marland Kitchen  Please note: voice recognition software was used to produce this document, and typos may escape review. Please contact Dr. Lyn Hollingshead for any needed clarifications.    Follow up plan: Return in  about 1 week (around 01/03/2019) for recheck anxiety, insomnia w/ Dr Lyn Hollingshead - call sooner if needed .

## 2019-01-03 ENCOUNTER — Ambulatory Visit: Payer: Managed Care, Other (non HMO) | Admitting: Osteopathic Medicine

## 2019-01-03 NOTE — Progress Notes (Deleted)
HPI: Ryan Livingston is a 48 y.o. male who  has a past medical history of DDD (degenerative disc disease), cervical, Eczema, Heart murmur, History of hand surgery (12/2017), Migraine, Nonrheumatic aortic valve insufficiency, and OA (osteoarthritis) of knee.  he presents to [facility] today, 01/03/19,  for chief complaint of: follow up insomnia and anxiety   Patient was seen 1 week ago for worsening anxiety and insomnia. Was started on Lexapro 10 mg and Ambien 5 mg. Currently  Sleep is   Counseling is   Nausea, loss of appetite has   Safe at home?  Patient is accompanied by *** who assists with history-taking.    Past medical, surgical, social and family history reviewed and updated as necessary.   Current medication list and allergy/intolerance information reviewed:    Current Outpatient Medications  Medication Sig Dispense Refill  . diclofenac sodium (VOLTAREN) 1 % GEL Apply 2-4 g topically 4 (four) times daily. To affected joint. 100 g 11  . escitalopram (LEXAPRO) 10 MG tablet Take 1 tablet (10 mg total) by mouth at bedtime. Take 0.5 tablet (5 mg total) po qhs for first week then increase to 10 mg 90 tablet 1  . hydrOXYzine (ATARAX/VISTARIL) 25 MG tablet Take 2 tablets (50 mg total) by mouth every 6 (six) hours as needed for anxiety. 12 tablet 0  . Omega-3 Fatty Acids (FISH OIL PO) Take by mouth.    . rizatriptan (MAXALT-MLT) 5 MG disintegrating tablet Take 1-2 tablets (5-10 mg total) by mouth as needed for migraine. May repeat in 2 hours if needed 10 tablet 3  . topiramate (TOPAMAX) 50 MG tablet One half tab by mouth daily for one week then one tab by mouth daily. 90 tablet 3  . traMADol (ULTRAM) 50 MG tablet Take 1-2 tablets (50-100 mg total) by mouth every 8 (eight) hours as needed for moderate pain. (Patient not taking: Reported on 12/27/2018) 45 tablet 0  . triamcinolone cream (KENALOG) 0.1 % Apply 1 application topically 2 (two) times daily. To  affected area(s) as needed 45 g 2  . zolpidem (AMBIEN) 5 MG tablet Take 1 tablet (5 mg total) by mouth at bedtime as needed for sleep. 15 tablet 1   No current facility-administered medications for this visit.     No Known Allergies    Review of Systems:  Constitutional:  No  fever, no chills, No recent illness, No unintentional weight changes. No significant fatigue.   HEENT: No  headache, no vision change, no hearing change, No sore throat, No  sinus pressure  Cardiac: No  chest pain, No  pressure, No palpitations  Respiratory:  No  shortness of breath. No  Cough  Gastrointestinal: No  abdominal pain, No  nausea, No  vomiting,  No  blood in stool, No  diarrhea  Musculoskeletal: No new myalgia/arthralgia  Skin: No  Rash  Neurologic: No  weakness, No  dizziness  Exam:  There were no vitals taken for this visit.  Constitutional: VS see above. General Appearance: alert, well-developed, well-nourished, NAD  Eyes: Normal lids and conjunctive, non-icteric sclera  Ears, Nose, Mouth, Throat: MMM, Normal external inspection ears/nares/mouth/lips/gums. TM normal bilaterally. Pharynx/tonsils no erythema, no exudate. Nasal mucosa normal.   Neck: No masses, trachea midline. No tenderness/mass appreciated. No lymphadenopathy  Respiratory: Normal respiratory effort. no wheeze, no rhonchi, no rales  Cardiovascular: S1/S2 normal, no murmur, no rub/gallop auscultated. RRR. No lower extremity edema.   Gastrointestinal: Nontender, no masses. Bowel sounds normal.  Musculoskeletal: Gait normal.   Neurological: Normal balance/coordination. No tremor.   Skin: warm, dry, intact.   Psychiatric: Normal judgment/insight. Normal mood and affect.   No results found for this or any previous visit (from the past 72 hour(s)).  No results found.   ASSESSMENT/PLAN: There were no encounter diagnoses.   ***  No orders of the defined types were placed in this encounter.   No orders of  the defined types were placed in this encounter.   There are no Patient Instructions on file for this visit.     Visit summary with medication list and pertinent instructions was printed for patient to review. All questions at time of visit were answered - patient instructed to contact office with any additional concerns or updates. ER/RTC precautions were reviewed with the patient.   Follow-up plan: No follow-ups on file.  Note: Total time spent *** minutes, greater than 50% of the visit was spent face-to-face counseling and coordinating care for the following: There were no encounter diagnoses..  Please note: voice recognition software was used to produce this document, and typos may escape review. Please contact Dr. Lyn Hollingshead for any needed clarifications.

## 2019-01-20 IMAGING — MR MR KNEE*L* W/O CM
6 series · 40 of 40 positions shown · non-contrast
Comparison: None.

CLINICAL DATA: Bilateral knee pain for 3-4 years.

EXAM:
MRI OF THE LEFT KNEE WITHOUT CONTRAST
TECHNIQUE: Multiplanar, multisequence MR imaging of the knee was performed. No
intravenous contrast was administered.

[Series 5: T2 fat-sat · axial · 4.0mm · 0.53mm/px · z∈[-91,+39]mm · 8 of 27 slices shown (1 of 3)]
[im 1/27]
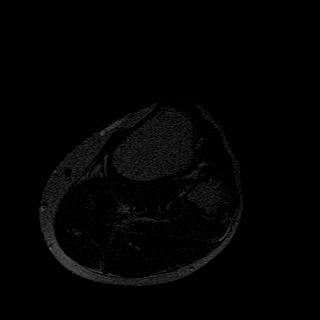
[im 4/27]
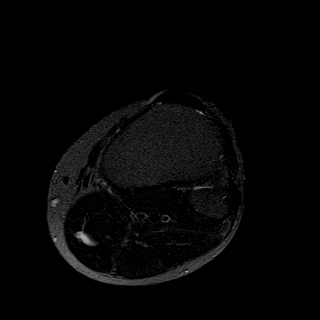
[im 8/27]
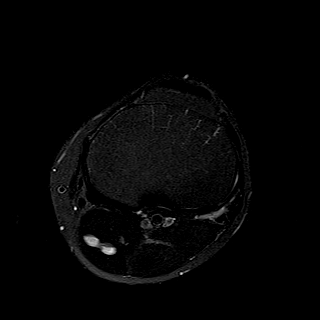
[im 12/27]
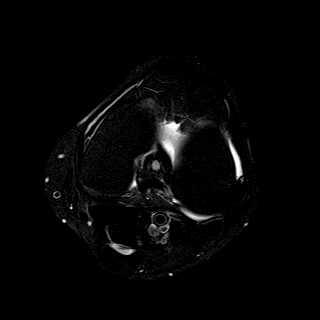
[im 15/27]
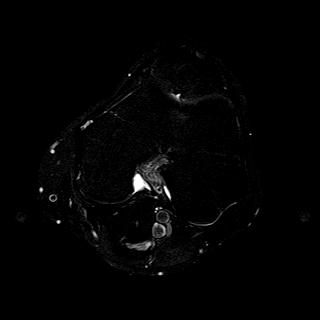
[im 19/27]
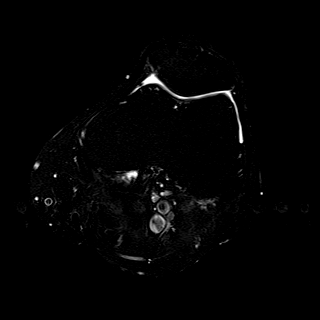
[im 23/27]
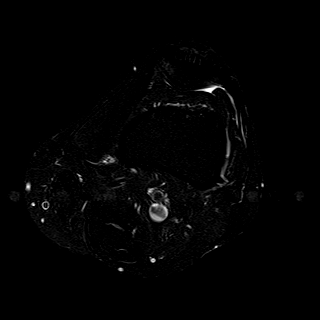
[im 27/27]
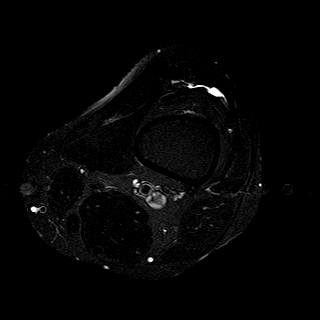

[Series 6: T1 · coronal · 4.0mm · 0.62mm/px · 6 of 23 slices shown]
[im 1/23]
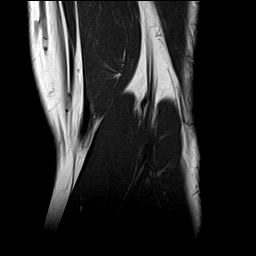
[im 5/23]
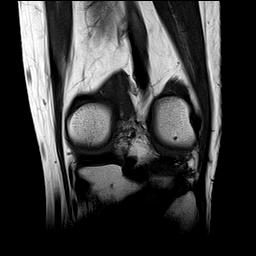
[im 9/23]
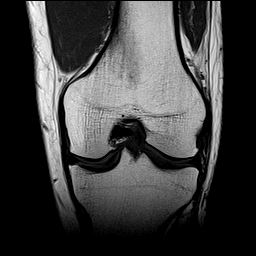
[im 14/23]
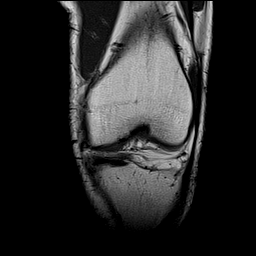
[im 18/23]
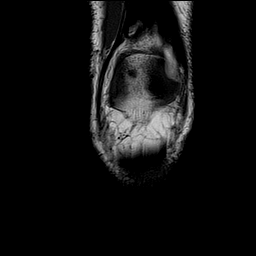
[im 23/23]
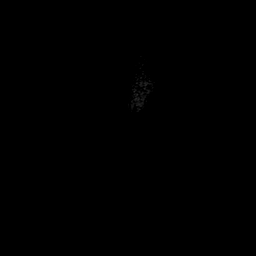

[Series 7: T2 fat-sat · coronal · 4.0mm · 0.62mm/px · 6 of 23 slices shown (2 of 3)]
[im 1/23]
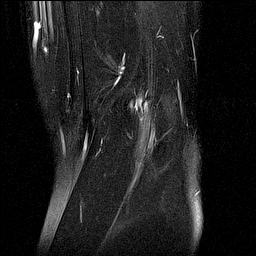
[im 5/23]
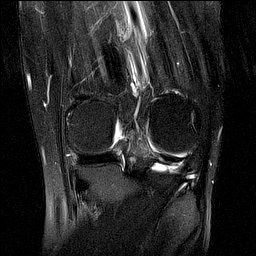
[im 9/23]
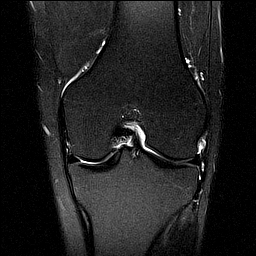
[im 14/23]
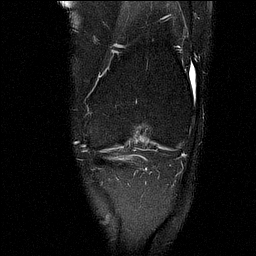
[im 18/23]
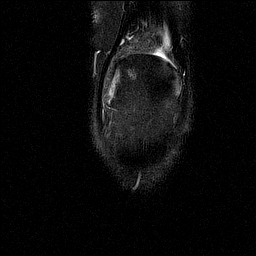
[im 23/23]
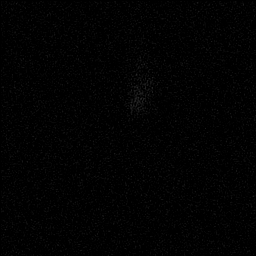

[Series 8: PD fat-sat · coronal · 4.0mm · 0.62mm/px · 6 of 23 slices shown (1 of 2)]
[im 1/23]
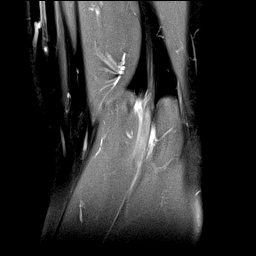
[im 5/23]
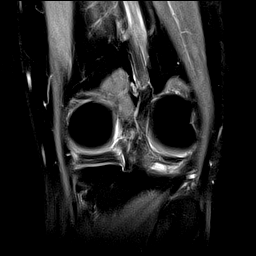
[im 9/23]
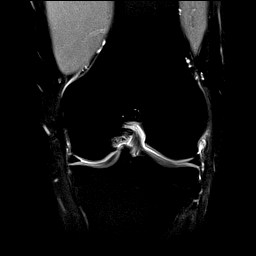
[im 14/23]
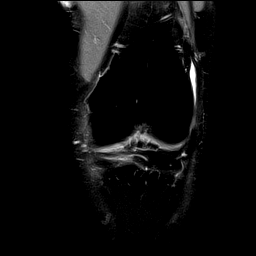
[im 18/23]
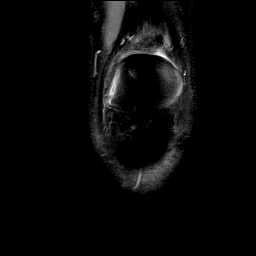
[im 23/23]
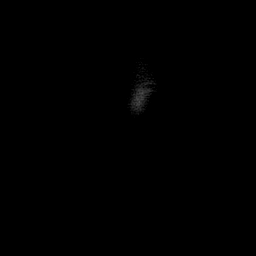

[Series 9: PD fat-sat · sagittal · 3.0mm · 0.62mm/px · 7 of 28 slices shown (2 of 2)]
[im 1/28]
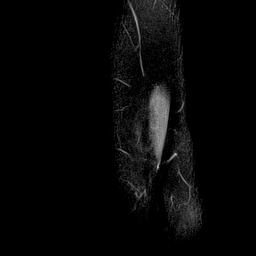
[im 5/28]
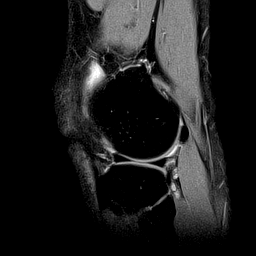
[im 10/28]
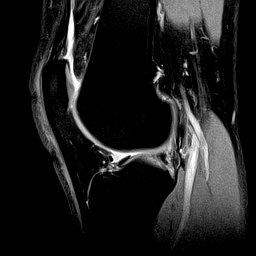
[im 14/28]
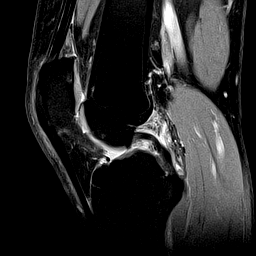
[im 19/28]
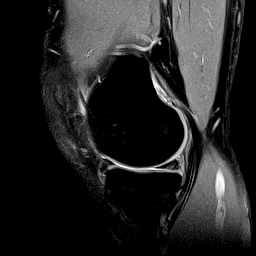
[im 23/28]
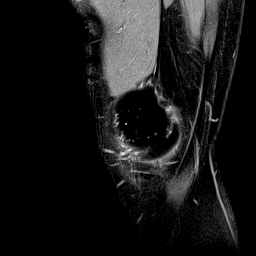
[im 28/28]
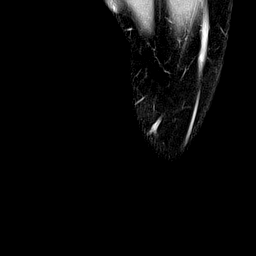

[Series 10: T2 fat-sat · sagittal · 3.0mm · 0.62mm/px · 7 of 28 slices shown (3 of 3)]
[im 1/28]
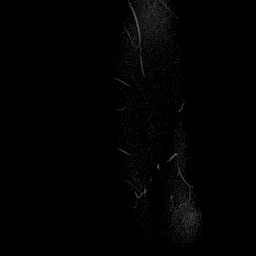
[im 5/28]
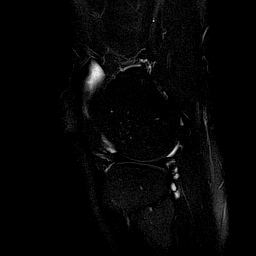
[im 10/28]
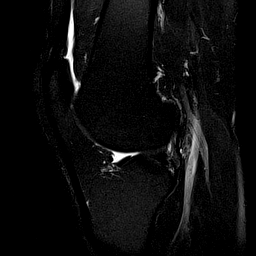
[im 14/28]
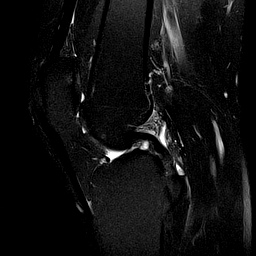
[im 19/28]
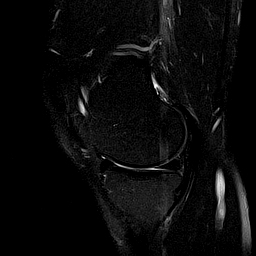
[im 23/28]
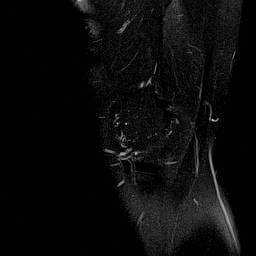
[im 28/28]
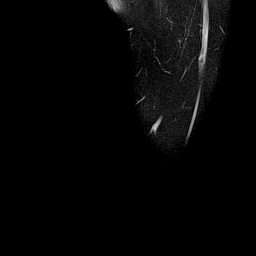

[40 of 40 positions shown; findings below may reference images not displayed]

FINDINGS: MENISCI

Medial meniscus: Oblique linear signal in the posterior horn of
medial meniscus extending to the inferior articular surface which
may reflect sequela prior meniscal repair versus a tear.

Lateral meniscus:  Intact.

LIGAMENTS

Cruciates:  Intact ACL and PCL.

Collaterals: Medial collateral ligament is intact. Lateral
collateral ligament complex is intact.

CARTILAGE

Patellofemoral: Partial-thickness cartilage loss of the medial
patellar facet with subchondral reactive marrow changes. High-grade
partial-thickness cartilage loss with a area of full-thickness
cartilage loss involving the trochlear groove with subchondral
reactive marrow edema.

Medial: Mild partial-thickness cartilage loss of the medial
femorotibial compartment.

Lateral:  No chondral defect.

Joint: Trace joint effusion. Normal Hoffa's fat. No plical
thickening.

Popliteal Fossa:  No Baker cyst. Intact popliteus tendon.

Extensor Mechanism: Intact quadriceps tendon. Intact patellar
tendon. Intact medial patellar retinaculum. Intact lateral patellar
retinaculum. Intact MPFL.

Bones: No other focal marrow signal abnormality. No fracture or
dislocation.

Other: No fluid collection or hematoma. No soft tissue mass. Small
partial tear of origin of the medial gastrocnemius muscle with a
small adjacent ganglion cyst.
IMPRESSION: 1. Partial-thickness cartilage loss of the medial patellar facet
with subchondral reactive marrow changes. High-grade
partial-thickness cartilage loss with a area of full-thickness
cartilage loss involving the trochlear groove with subchondral
reactive marrow edema..
2. Oblique linear signal in the posterior horn of medial meniscus
extending to the inferior articular surface which may reflect
sequela prior meniscal repair versus a tear.
3. Mild partial-thickness cartilage loss of the medial femorotibial
compartment.
4. Small partial tear of origin of the medial gastrocnemius muscle
with a small adjacent ganglion cyst.

## 2019-08-12 LAB — HM COLONOSCOPY

## 2019-08-24 ENCOUNTER — Encounter: Payer: Self-pay | Admitting: Osteopathic Medicine

## 2019-09-02 ENCOUNTER — Encounter: Payer: Self-pay | Admitting: Osteopathic Medicine

## 2020-02-10 ENCOUNTER — Telehealth (INDEPENDENT_AMBULATORY_CARE_PROVIDER_SITE_OTHER): Payer: Managed Care, Other (non HMO) | Admitting: Family Medicine

## 2020-02-10 ENCOUNTER — Encounter: Payer: Self-pay | Admitting: Family Medicine

## 2020-02-10 VITALS — Wt 187.0 lb

## 2020-02-10 DIAGNOSIS — R0981 Nasal congestion: Secondary | ICD-10-CM | POA: Diagnosis not present

## 2020-02-10 DIAGNOSIS — Z20822 Contact with and (suspected) exposure to covid-19: Secondary | ICD-10-CM | POA: Diagnosis not present

## 2020-02-10 DIAGNOSIS — R059 Cough, unspecified: Secondary | ICD-10-CM

## 2020-02-10 LAB — POCT INFLUENZA A/B
Influenza A, POC: NEGATIVE
Influenza B, POC: NEGATIVE

## 2020-02-10 MED ORDER — BENZONATATE 200 MG PO CAPS: 200.0000 mg | ORAL_CAPSULE | Freq: Three times a day (TID) | ORAL | 0 refills | Status: AC | PRN

## 2020-02-10 NOTE — Progress Notes (Signed)
Ryan Livingston - 49 y.o. male MRN [id number]  Date of birth: [birth year]   This visit type was conducted due to national recommendations for restrictions regarding the COVID-19 Pandemic (e.g. social distancing).  This format is felt to be most appropriate for this patient at this time.  All issues noted in this document were discussed and addressed.  No physical exam was performed (except for noted visual exam findings with Video Visits).  I discussed the limitations of evaluation and management by telemedicine and the availability of in person appointments. The patient expressed understanding and agreed to proceed.  I connected with@ on 02/10/20 at 11:10 AM EST by a video enabled telemedicine application and verified that I am speaking with the correct person using two identifiers.  Present at visit: Everrett Coombe, DO Lafayette Dragon   Patient Location: Home [address] [place] [address]   Provider location:   [facility]  Chief Complaint  Patient presents with  . Sinusitis    HPI  Ryan Livingston is a 49 y.o. male who presents via audio/video conferencing for a telehealth visit today.  He has complaint of cough, body aches, chills, sinus congestion/pain.  He had cold symptoms about 3 weeks ago, resolved with abx and cough medications from NP at work.  Current symptoms started 2-3 days ago.  He denies shortness of breath, nausea, vomiting, diarrhea, headache.  He has used mucinex-D with some relief.  He is drinking a good amount of fluids.     ROS:  A comprehensive ROS was completed and negative except as noted per HPI  Past Medical History:  Diagnosis Date  . DDD (degenerative disc disease), cervical   . Eczema   . Heart murmur   . History of hand surgery 12/2017  . Migraine   . Nonrheumatic aortic valve insufficiency   . OA (osteoarthritis) of knee    both knees    Past Surgical History:  Procedure Laterality Date  . CHOLECYSTECTOMY    . KNEE ARTHROPLASTY Bilateral 2015    meniscal repair   . KNEE ARTHROSCOPY  2015   bilateral, done in [place]  . LASIK    . TONSILECTOMY/ADENOIDECTOMY WITH MYRINGOTOMY      Family History  Problem Relation Age of Onset  . Diabetes Maternal Grandmother   . Stroke Maternal Grandfather   . HIV Father   . Healthy Son   . Healthy Son     Social History   Socioeconomic History  . Marital status: Married    Spouse name: Not on file  . Number of children: Not on file  . Years of education: Not on file  . Highest education level: Not on file  Occupational History  . Not on file  Tobacco Use  . Smoking status: Former Smoker    Quit date: 1989    Years since quitting: 32.9  . Smokeless tobacco: Never Used  Vaping Use  . Vaping Use: Never used  Substance and Sexual Activity  . Alcohol use: No  . Drug use: No  . Sexual activity: Not Currently  Other Topics Concern  . Not on file  Social History Narrative  . Not on file   Social Determinants of Health   Financial Resource Strain: Not on file  Food Insecurity: Not on file  Transportation Needs: Not on file  Physical Activity: Not on file  Stress: Not on file  Social Connections: Not on file  Intimate Partner Violence: Not on file     Current Outpatient Medications:  .  benzonatate (TESSALON) 200 MG capsule, Take 1 capsule (200 mg total) by mouth 3 (three) times daily as needed for cough., Disp: 30 capsule, Rfl: 0 .  diclofenac sodium (VOLTAREN) 1 % GEL, Apply 2-4 g topically 4 (four) times daily. To affected joint., Disp: 100 g, Rfl: 11 .  escitalopram (LEXAPRO) 10 MG tablet, Take 1 tablet (10 mg total) by mouth at bedtime. Take 0.5 tablet (5 mg total) po qhs for first week then increase to 10 mg, Disp: 90 tablet, Rfl: 1 .  hydrOXYzine (ATARAX/VISTARIL) 25 MG tablet, Take 2 tablets (50 mg total) by mouth every 6 (six) hours as needed for anxiety., Disp: 12 tablet, Rfl: 0 .  Omega-3 Fatty Acids (FISH OIL PO), Take by mouth., Disp: , Rfl:  .  rizatriptan  (MAXALT-MLT) 5 MG disintegrating tablet, Take 1-2 tablets (5-10 mg total) by mouth as needed for migraine. May repeat in 2 hours if needed, Disp: 10 tablet, Rfl: 3 .  topiramate (TOPAMAX) 50 MG tablet, One half tab by mouth daily for one week then one tab by mouth daily., Disp: 90 tablet, Rfl: 3 .  traMADol (ULTRAM) 50 MG tablet, Take 1-2 tablets (50-100 mg total) by mouth every 8 (eight) hours as needed for moderate pain. (Patient not taking: Reported on 12/27/2018), Disp: 45 tablet, Rfl: 0 .  triamcinolone cream (KENALOG) 0.1 %, Apply 1 application topically 2 (two) times daily. To affected area(s) as needed, Disp: 45 g, Rfl: 2 .  zolpidem (AMBIEN) 5 MG tablet, Take 1 tablet (5 mg total) by mouth at bedtime as needed for sleep., Disp: 15 tablet, Rfl: 1  EXAM:  VITALS per patient if applicable: Wt 187 lb (84.8 kg)   BMI 26.83 kg/m   GENERAL: alert, oriented, appears well and in no acute distress  HEENT: atraumatic, conjunttiva clear, no obvious abnormalities on inspection of external nose and ears  NECK: normal movements of the head and neck  LUNGS: on inspection no signs of respiratory distress, breathing rate appears normal, no obvious gross SOB, gasping or wheezing  CV: no obvious cyanosis  MS: moves all visible extremities without noticeable abnormality  PSYCH/NEURO: pleasant and cooperative, no obvious depression or anxiety, speech and thought processing grossly intact  ASSESSMENT AND PLAN:  Discussed the following assessment and plan:  Suspected COVID-19 virus infection He will stop by to have flu and COVID testing.  Recommend supportive care at home with increased fluids and rest.  He may continue Mucinex-D for now. Humidifier may help some with cough.  Rx for tessalon sent in.   He will need to quarantine until test results return.      I discussed the assessment and treatment plan with the patient. The patient was provided an opportunity to ask questions and all were  answered. The patient agreed with the plan and demonstrated an understanding of the instructions.   The patient was advised to call back or seek an in-person evaluation if the symptoms worsen or if the condition fails to improve as anticipated.    Everrett Coombe, DO

## 2020-02-10 NOTE — Assessment & Plan Note (Signed)
He will stop by to have flu and COVID testing.  Recommend supportive care at home with increased fluids and rest.  He may continue Mucinex-D for now. Humidifier may help some with cough.  Rx for tessalon sent in.   He will need to quarantine until test results return.

## 2020-02-10 NOTE — Progress Notes (Signed)
Symptoms started prior to Thanksgiving.  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 Tested negative for COVID @ Thanksgiving  Cough restarted yesterday. Cough closes airway, chills, body aches Took Mucinex D yesterday. Mucus stops at the bottom of his throat.

## 2020-02-12 LAB — SARS-COV-2, NAA 2 DAY TAT

## 2020-02-12 LAB — NOVEL CORONAVIRUS, NAA: SARS-CoV-2, NAA: DETECTED — AB

## 2020-02-13 ENCOUNTER — Encounter: Payer: Self-pay | Admitting: Osteopathic Medicine

## 2020-02-13 ENCOUNTER — Telehealth: Payer: Self-pay | Admitting: Osteopathic Medicine

## 2020-02-13 NOTE — Telephone Encounter (Signed)
Patient had a virtual visit Friday with Dr.Matthews and got his positive Covid results this weekend and has been feeling bad and would like for someone to call him to give him advice on what he can do. Call patient back at [phone]

## 2020-02-13 NOTE — Telephone Encounter (Signed)
To Panya - I sent MyChart message, sounds like she is on the phone with him now.Marland KitchenMarland Kitchen

## 2020-02-14 ENCOUNTER — Other Ambulatory Visit: Payer: Self-pay | Admitting: Physician Assistant

## 2020-02-14 DIAGNOSIS — I517 Cardiomegaly: Secondary | ICD-10-CM

## 2020-02-14 DIAGNOSIS — Z6828 Body mass index (BMI) 28.0-28.9, adult: Secondary | ICD-10-CM

## 2020-02-14 DIAGNOSIS — R011 Cardiac murmur, unspecified: Secondary | ICD-10-CM

## 2020-02-14 DIAGNOSIS — U071 COVID-19: Secondary | ICD-10-CM

## 2020-02-14 NOTE — Progress Notes (Signed)
I connected by phone with Lafayette Dragon on 02/14/2020 at 4:09 PM to discuss the potential use of a new treatment for mild to moderate COVID-19 viral infection in non-hospitalized patients.  This patient is a 49 y.o. male that meets the FDA criteria for Emergency Use Authorization of COVID monoclonal antibody sotrovimab, casirivimab/imdevimab or bamlamivimab/estevimab.  Has a (+) direct SARS-CoV-2 viral test result  Has mild or moderate COVID-19   Is NOT hospitalized due to COVID-19  Is within 10 days of symptom onset  Has at least one of the high risk factor(s) for progression to severe COVID-19 and/or hospitalization as defined in EUA.  Specific high risk criteria : BMI > 25 and Cardiovascular disease or hypertension   I have spoken and communicated the following to the patient or parent/caregiver regarding COVID monoclonal antibody treatment:  1. FDA has authorized the emergency use for the treatment of mild to moderate COVID-19 in adults and pediatric patients with positive results of direct SARS-CoV-2 viral testing who are 66 years of age and older weighing at least 40 kg, and who are at high risk for progressing to severe COVID-19 and/or hospitalization.  2. The significant known and potential risks and benefits of COVID monoclonal antibody, and the extent to which such potential risks and benefits are unknown.  3. Information on available alternative treatments and the risks and benefits of those alternatives, including clinical trials.  4. Patients treated with COVID monoclonal antibody should continue to self-isolate and use infection control measures (e.g., wear mask, isolate, social distance, avoid sharing personal items, clean and disinfect "high touch" surfaces, and frequent handwashing) according to CDC guidelines.   5. The patient or parent/caregiver has the option to accept or refuse COVID monoclonal antibody treatment.  After reviewing this information with the patient,  the patient has agreed to receive one of the available covid 19 monoclonal antibodies and will be provided an appropriate fact sheet prior to infusion.  Sx onset 12/17. Set up for infusion on 12/22 @ 2:30pm. Directions given to [facility]. Pt is aware that insurance will be charged an infusion fee. Pt is unvaccinated. + in epic.   Cline Crock 02/14/2020 4:09 PM

## 2020-02-15 ENCOUNTER — Ambulatory Visit (HOSPITAL_COMMUNITY)
Admission: RE | Admit: 2020-02-15 | Discharge: 2020-02-15 | Disposition: A | Discharge status: Home / Self Care | Payer: Managed Care, Other (non HMO) | Source: Ambulatory Visit | Attending: Pulmonary Disease | Admitting: Pulmonary Disease

## 2020-02-15 DIAGNOSIS — Z6828 Body mass index (BMI) 28.0-28.9, adult: Secondary | ICD-10-CM | POA: Diagnosis present

## 2020-02-15 DIAGNOSIS — I517 Cardiomegaly: Secondary | ICD-10-CM | POA: Insufficient documentation

## 2020-02-15 DIAGNOSIS — R011 Cardiac murmur, unspecified: Secondary | ICD-10-CM | POA: Diagnosis not present

## 2020-02-15 DIAGNOSIS — U071 COVID-19: Secondary | ICD-10-CM | POA: Insufficient documentation

## 2020-02-15 MED ORDER — SODIUM CHLORIDE 0.9 % IV BOLUS
1000.0000 mL | Freq: Once | INTRAVENOUS | Status: AC
  Administered 2020-02-15: 1000 mL via INTRAVENOUS

## 2020-02-15 MED ORDER — EPINEPHRINE 0.3 MG/0.3ML IJ SOAJ: 0.3000 mg | Freq: Once | INTRAMUSCULAR | Status: DC | PRN

## 2020-02-15 MED ORDER — FAMOTIDINE IN NACL 20-0.9 MG/50ML-% IV SOLN: 20.0000 mg | Freq: Once | INTRAVENOUS | Status: DC | PRN

## 2020-02-15 MED ORDER — ALBUTEROL SULFATE HFA 108 (90 BASE) MCG/ACT IN AERS: 2.0000 | INHALATION_SPRAY | Freq: Once | RESPIRATORY_TRACT | Status: DC | PRN

## 2020-02-15 MED ORDER — DIPHENHYDRAMINE HCL 50 MG/ML IJ SOLN: 50.0000 mg | Freq: Once | INTRAMUSCULAR | Status: DC | PRN

## 2020-02-15 MED ORDER — SODIUM CHLORIDE 0.9 % IV SOLN: Freq: Once | INTRAVENOUS | Status: AC

## 2020-02-15 MED ORDER — METHYLPREDNISOLONE SODIUM SUCC 125 MG IJ SOLR: 125.0000 mg | Freq: Once | INTRAMUSCULAR | Status: DC | PRN

## 2020-02-15 MED ORDER — SODIUM CHLORIDE 0.9 % IV SOLN: INTRAVENOUS | Status: DC | PRN

## 2020-02-15 NOTE — Progress Notes (Signed)
Patient reviewed Fact Sheet for Patients, Parents, and Caregivers for Emergency Use Authorization (EUA) of bamlanivimab and etesevimab for the Treatment of Coronavirus. Patient also reviewed and is agreeable to the estimated cost of treatment. Patient is agreeable to proceed.   

## 2020-02-15 NOTE — Progress Notes (Signed)
  Diagnosis: COVID-19  Physician: Dr. Patrick Wright   Procedure: Covid Infusion Clinic Med: bamlanivimab\etesevimab infusion - Provided patient with bamlanimivab\etesevimab fact sheet for patients, parents and caregivers prior to infusion.  Complications: No immediate complications noted.  Discharge: Discharged home   [name] Lynn 02/15/2020   

## 2020-02-15 NOTE — Discharge Instructions (Signed)
10 Things You Can Do to Manage Your COVID-19 Symptoms at Home If you have possible or confirmed COVID-19: 1. Stay home from work and school. And stay away from other public places. If you must go out, avoid using any kind of public transportation, ridesharing, or taxis. 2. Monitor your symptoms carefully. If your symptoms get worse, call your healthcare provider immediately. 3. Get rest and stay hydrated. 4. If you have a medical appointment, call the healthcare provider ahead of time and tell them that you have or may have COVID-19. 5. For medical emergencies, call 911 and notify the dispatch personnel that you have or may have COVID-19. 6. Cover your cough and sneezes with a tissue or use the inside of your elbow. 7. Wash your hands often with soap and water for at least 20 seconds or clean your hands with an alcohol-based hand sanitizer that contains at least 60% alcohol. 8. As much as possible, stay in a specific room and away from other people in your home. Also, you should use a separate bathroom, if available. If you need to be around other people in or outside of the home, wear a mask. 9. Avoid sharing personal items with other people in your household, like dishes, towels, and bedding. 10. Clean all surfaces that are touched often, like counters, tabletops, and doorknobs. Use household cleaning sprays or wipes according to the label instructions. cdc.gov/coronavirus 08/25/2018 This information is not intended to replace advice given to you by your health care provider. Make sure you discuss any questions you have with your health care provider. Document Revised: 01/27/2019 Document Reviewed: 01/27/2019 Elsevier Patient Education  2020 [employer] What types of side effects do monoclonal antibody drugs cause?  Common side effects  In general, the more common side effects caused by monoclonal antibody drugs include: . Allergic reactions, such as hives or itching . Flu-like signs and  symptoms, including chills, fatigue, fever, and muscle aches and pains . Nausea, vomiting . Diarrhea . Skin rashes . Low blood pressure   The CDC is recommending patients who receive monoclonal antibody treatments wait at least 90 days before being vaccinated.  Currently, there are no data on the safety and efficacy of mRNA COVID-19 vaccines in persons who received monoclonal antibodies or convalescent plasma as part of COVID-19 treatment. Based on the estimated half-life of such therapies as well as evidence suggesting that reinfection is uncommon in the 90 days after initial infection, vaccination should be deferred for at least 90 days, as a precautionary measure until additional information becomes available, to avoid interference of the antibody treatment with vaccine-induced immune responses. If you have any questions or concerns after the infusion please call the Advanced Practice Provider on call at [phone]. This number is ONLY intended for your use regarding questions or concerns about the infusion post-treatment side-effects.  Please do not provide this number to others for use. For return to work notes please contact your primary care provider.   If someone you know is interested in receiving treatment please have them call the COVID hotline at [phone].   

## 2020-02-21 ENCOUNTER — Telehealth (INDEPENDENT_AMBULATORY_CARE_PROVIDER_SITE_OTHER): Payer: Managed Care, Other (non HMO) | Admitting: Nurse Practitioner

## 2020-02-21 ENCOUNTER — Encounter: Payer: Self-pay | Admitting: Nurse Practitioner

## 2020-02-21 DIAGNOSIS — J329 Chronic sinusitis, unspecified: Secondary | ICD-10-CM | POA: Diagnosis not present

## 2020-02-21 DIAGNOSIS — U071 COVID-19: Secondary | ICD-10-CM | POA: Diagnosis not present

## 2020-02-21 DIAGNOSIS — J40 Bronchitis, not specified as acute or chronic: Secondary | ICD-10-CM | POA: Diagnosis not present

## 2020-02-21 MED ORDER — AMOXICILLIN-POT CLAVULANATE 875-125 MG PO TABS: 1.0000 | ORAL_TABLET | Freq: Two times a day (BID) | ORAL | 0 refills | Status: AC

## 2020-02-21 MED ORDER — AZITHROMYCIN 250 MG PO TABS: ORAL_TABLET | ORAL | 0 refills | Status: AC

## 2020-02-21 MED ORDER — ALBUTEROL SULFATE HFA 108 (90 BASE) MCG/ACT IN AERS: 1.0000 | INHALATION_SPRAY | RESPIRATORY_TRACT | 1 refills | Status: AC | PRN

## 2020-02-21 NOTE — Patient Instructions (Addendum)
Over the counter medications that may be helpful for symptoms: . Guaifenesin 1200 mg extended release tabs twice daily, with plenty of water o For cough and congestion . Brand name: Mucinex  - The Mucinex D version is available behind the pharmacy counter and can be very helpful as it has psuedoephedrine in the formula.  . Pseudoephedrine 30 mg, one or two tabs every 4 to 6 hours o For sinus congestion o Brand name: Sudafed o You must get this from the pharmacy counter.  . Oxymetazoline nasal spray each morning, one spray in each nostril, for NO MORE THAN 3 days  o For nasal and sinus congestion o Brand name: Afrin . Saline nasal spray or Saline Nasal Irrigation 3-5 times a day o For nasal and sinus congestion o Brand names: Ocean or AYR . Fluticasone nasal spray, one spray in each nostril, each morning after oxymetazoline and saline, if used o For nasal and sinus congestion o Brand name: Flonase . Warm salt water gargles  o For sore throat o Every few hours as needed . Alternate ibuprofen 400-600 mg and acetaminophen 1000 mg every 4-6 hours o For fever, body aches, headache o Brand names: Motrin or Advil and Tylenol . Dextromethorphan 12-hour cough version 30 mg every 12 hours  o For cough o Brand name: Delsym Stop all other cold medications for now (Nyquil, Dayquil, Tylenol Cold, Theraflu, etc) and other non-prescription cough/cold preparations. Many of these have the same ingredients listed above and could cause an overdose of medication.   General Instructions . Allow your body to rest . Drink PLENTY of fluids . Isolate yourself from everyone, even family, until test results have returned  If your COVID-19 test is positive . Then you ARE INFECTED and you can pass the virus to others . You must quarantine from others for a minimum of  o 10 days since symptoms started AND o You are fever free for 24 hours WITHOUT any medication to reduce fever AND o Your symptoms are  improving . Do not go to the store or other public areas . Do not go around household members who are not known to be infected with COVID-19 . If you MUST leave you area of quarantine (example: go to a bathroom you share with others in your home), you must o Wear a mask o Wash your hands thoroughly o Wipe down any surfaces you touch . Do not share food, drinks, towels, or other items with other persons . Dispose of your own tissues, food containers, etc  Once you have recovered, please continue good preventive care measures, including:  . wearing a mask when in public . wash your hands frequently . avoid touching your face/nose/eyes . cover coughs/sneezes with the inside of your elbow . stay out of crowds . keep a 6 foot distance from others  Go to the nearest hospital emergency room if fever/cough/breathlessness are severe or illness seems like a threat to life.

## 2020-02-21 NOTE — Progress Notes (Signed)
Virtual Video Visit via MyChart Note  I connected with  Ryan Livingston on 02/21/20 at  9:50 AM EST by the video enabled telemedicine application for , MyChart, and verified that I am speaking with the correct person using two identifiers.   I introduced myself as a Publishing rights manager with the practice. We discussed the limitations of evaluation and management by telemedicine and the availability of in person appointments. The patient expressed understanding and agreed to proceed.  Participating parties in this visit include: The patient and the nurse practitioner listed The patient is: at home I am: in the office  Subjective:    CC:  Chief Complaint  Patient presents with  . Covid Positive    Tested COVID positive on 02/10/2020, still feeling feverish, sinus pain/pressure, facial pressure, PND, blood tinged nasal mucus in the morning that changes to clear as the day progresses, non-productive cough, chest congestion, has been taking Benadryl at night, and Nyquil with minimal relief    HPI: Ryan Livingston is a 49 y.o. y/o male presenting via MyChart today for ongoing symptoms associated with COVID-19 infection. He reports his symptoms first started before Thanksgiving with sinus pain and pressure, post nasal drip, cough, shortness of breath with exertion, and fatigue. He has had the mAb infusion treatment and has quarantined for a full 10 day period, which ended yesterday (02/20/2020), but is still experiencing significant symptoms associated with the virus. He was unable to return to work today due to symptoms.   He endorses chills, fevers, sinus pain and pressure, facial pressure, increased mucous production- blood tinged, chest congestion with cough, and post nasal drip. He reports his symptoms are worse in the evenings and at night. He is also still experiencing severe fatigue and difficulty regaining his stamina. He reports that he did feel his symptoms began to improve initially, but then  worsened again.   He denies shortness of breath at rest or difficulty breathing. He has been taking Nyquil to help with symptoms. He has not had antibiotics in the last 30 days.   Past medical history, Surgical history, Family history not pertinant except as noted below, Social history, Allergies, and medications have been entered into the medical record, reviewed, and corrections made.   Review of Systems:  All review of systems negative except what is listed in the HPI   Objective:    General: Speaking clearly in complete sentences without any shortness of breath.  Dry cough is present with audible congestion.  Alert and oriented x3.   Normal judgment.  No apparent acute distress.   Impression and Recommendations:    1. Sinobronchitis Symptoms and presentation consistent with sinobronchitis and possible CAP related to recent COVID-19 infection.  The course of his illness is consistent with secondary bacterial infection post viral infection.  Will begin treatment today for both sinusitis and suspected CAP with dual therapy of augmentin and azithromycin.  Discussed OTC medication recommendations and provided on AVS for patient.  Will also send script for albuterol inhaler for cough and shortness of breath.  Recommend monitoring for ongoing or worsening symptoms despite treatment. Discussed that some patients will experienced prolonged symptoms after COVID-19 that require physical therapy or pulmonary rehab for recovery. Patient has applied for short-term disability and will send the paperwork once it is received. We may need to consider therapy if symptoms persist.  Follow-up if symptoms worsen or fail to improve.  - amoxicillin-clavulanate (AUGMENTIN) 875-125 MG tablet; Take 1 tablet by mouth 2 (two) times daily.  Dispense: 10 tablet; Refill: 0 - azithromycin (ZITHROMAX) 250 MG tablet; Take 2 tabs (500 mg) together on the first day, then 1 tab (250 mg) daily until prescription  complete.  Dispense: 10 tablet; Refill: 0 - albuterol (VENTOLIN HFA) 108 (90 Base) MCG/ACT inhaler; Inhale 1-2 puffs into the lungs every 4 (four) hours as needed for wheezing or shortness of breath.  Dispense: 6.7 g; Refill: 1  2. COVID-19 Ongoing symptoms of COVID-19 suggestive of long COVID and secondary bacterial infection.  Encouraged patient to rest and continue to remain out of work until at least 02/27/2020 - work excuse provided.  If symptoms of fatigue and shortness of breath continue, consider pulmonary rehab or physical therapy to help increase stamina.  Patient will send short term disability paperwork once it is received for [place] to fill out.  Discussed emergency symptoms that would warrant evaluation and recommend follow-up if symptoms worsen or fail to improve.  - albuterol (VENTOLIN HFA) 108 (90 Base) MCG/ACT inhaler; Inhale 1-2 puffs into the lungs every 4 (four) hours as needed for wheezing or shortness of breath.  Dispense: 6.7 g; Refill: 1     I discussed the assessment and treatment plan with the patient. The patient was provided an opportunity to ask questions and all were answered. The patient agreed with the plan and demonstrated an understanding of the instructions.   The patient was advised to call back or seek an in-person evaluation if the symptoms worsen or if the condition fails to improve as anticipated.  I provided 20 minutes of non-face-to-face interaction with this MYCHART visit including intake, same-day documentation, and chart review.   Tollie Eth, NP

## 2020-02-22 ENCOUNTER — Encounter: Payer: Self-pay | Admitting: Osteopathic Medicine

## 2020-09-18 ENCOUNTER — Ambulatory Visit: Payer: Managed Care, Other (non HMO) | Admitting: Sports Medicine

## 2024-03-11 ENCOUNTER — Ambulatory Visit (HOSPITAL_BASED_OUTPATIENT_CLINIC_OR_DEPARTMENT_OTHER)
Admission: RE | Admit: 2024-03-11 | Discharge: 2024-03-11 | Disposition: A | Discharge status: Home / Self Care | Attending: Family Medicine | Admitting: Family Medicine

## 2024-03-11 ENCOUNTER — Encounter (HOSPITAL_BASED_OUTPATIENT_CLINIC_OR_DEPARTMENT_OTHER): Payer: Self-pay

## 2024-03-11 VITALS — BP 113/76 | HR 71 | Temp 98.3°F | Resp 20

## 2024-03-11 DIAGNOSIS — R52 Pain, unspecified: Secondary | ICD-10-CM

## 2024-03-11 DIAGNOSIS — M25571 Pain in right ankle and joints of right foot: Secondary | ICD-10-CM

## 2024-03-11 DIAGNOSIS — R2689 Other abnormalities of gait and mobility: Secondary | ICD-10-CM | POA: Diagnosis not present

## 2024-03-11 DIAGNOSIS — M79671 Pain in right foot: Secondary | ICD-10-CM

## 2024-03-11 MED ORDER — OXYCODONE HCL 5 MG PO TABS: 5.0000 mg | ORAL_TABLET | Freq: Four times a day (QID) | ORAL | 0 refills | Status: AC | PRN

## 2024-03-11 MED ORDER — KETOROLAC TROMETHAMINE 30 MG/ML IJ SOLN
30.0000 mg | Freq: Once | INTRAMUSCULAR | Status: AC
  Administered 2024-03-11: 30 mg via INTRAMUSCULAR

## 2024-03-11 MED ORDER — KETOROLAC TROMETHAMINE 10 MG PO TABS: 10.0000 mg | ORAL_TABLET | Freq: Four times a day (QID) | ORAL | 0 refills | Status: AC | PRN

## 2024-03-11 NOTE — ED Triage Notes (Signed)
 Patient has been having some discomfort to right foot. Last night, took a step and felt a pop. Was seen at [facility] ER. Was told no fracture. Placed patient in a CAM boot. Advised that he needed an MRI likely but it could not be ordered by the ER due to patient needing to see primary. Patient was able to get an ortho appt for Tuesday. Presents today for re-eval. Patient states was told he should be able to ambulate with boot on but is having severe pain when bearing weight. Patient presents with CD of x-rays and reports. Hoping to find out what is wrong as he needs to return to work Wednesday.

## 2024-03-11 NOTE — ED Provider Notes (Signed)
 " Ryan Livingston    CSN: [id number] Arrival date & time: 03/11/24  1153      History   Chief Complaint Chief Complaint  Patient presents with   Foot Pain    HPI Ryan Livingston is a 54 y.o. male.   54 year old male who was walking on his driveway and felt a pop in his right foot and he fell because he could not even stand up due to the pain.  He ended up at the emergency room at [facility] and had ankle and foot x-rays that show a heel spur but otherwise no abnormalities.  He has an appointment with [facility] on 03/15/2024.  He has a cam boot and crutches.  He says he is having severe right lateral midfoot pain and some right lower ankle pain.  He is not able to walk with the cam boot and is really not able to put weight on his foot at all.  He is hoping for more input today about what to do about his foot.   Foot Pain Pertinent negatives include no chest pain and no abdominal pain.    Past Medical History:  Diagnosis Date   DDD (degenerative disc disease), cervical    Eczema    Heart murmur    History of hand surgery 12/2017   Migraine    Nonrheumatic aortic valve insufficiency    OA (osteoarthritis) of knee    both knees    Patient Active Problem List   Diagnosis Date Noted   Suspected COVID-19 virus infection 02/10/2020   Chronic migraine without aura without status migrainosus, not intractable 11/12/2018   Primary osteoarthritis of both hands 09/25/2017   DDD (degenerative disc disease), lumbar 09/25/2017   Polyarthralgia 05/21/2017   Malaise 01/19/2017   Cough present for greater than 3 weeks 01/19/2017   Night sweats 01/19/2017   Colicky LUQ abdominal pain 08/05/2016   Migraine headache 08/05/2016   Eczema 08/05/2016   DDD (degenerative disc disease), cervical 01/30/2016   Bilateral foot pain 01/30/2016   Annual physical exam 11/01/2015   LVH (left ventricular hypertrophy) 11/01/2015   Heart murmur 11/01/2015   Diastolic dysfunction  11/01/2015   Chest pain 11/01/2015   Abnormal EKG 11/01/2015   Primary osteoarthritis of both knees 10/25/2015   Osteoarthritis of carpometacarpal joint of left thumb 10/25/2015    Past Surgical History:  Procedure Laterality Date   CHOLECYSTECTOMY     KNEE ARTHROPLASTY Bilateral 2015   meniscal repair    KNEE ARTHROSCOPY  2015   bilateral, done in [place]    LASIK     TONSILECTOMY/ADENOIDECTOMY WITH MYRINGOTOMY         Home Medications    Prior to Admission medications  Medication Sig Start Date End Date Taking? Authorizing Provider  ketorolac  (TORADOL ) 10 MG tablet Take 1 tablet (10 mg total) by mouth every 6 (six) hours as needed. 03/11/24  Yes Ival Domino, FNP  oxyCODONE  (ROXICODONE ) 5 MG immediate release tablet Take 1 tablet (5 mg total) by mouth every 6 (six) hours as needed for severe pain (pain score 7-10). 03/11/24  Yes Ival Domino, FNP  albuterol  (VENTOLIN  HFA) 108 (90 Base) MCG/ACT inhaler Inhale 1-2 puffs into the lungs every 4 (four) hours as needed for wheezing or shortness of breath. 02/21/20   Early, Sara E, NP  amoxicillin -clavulanate (AUGMENTIN ) 875-125 MG tablet Take 1 tablet by mouth 2 (two) times daily. 02/21/20   Early, Sara E, NP  azithromycin  (ZITHROMAX ) 250 MG tablet Take 2 tabs (  500 mg) together on the first day, then 1 tab (250 mg) daily until prescription complete. 02/21/20   Early, Sara E, NP  benzonatate  (TESSALON ) 200 MG capsule Take 1 capsule (200 mg total) by mouth 3 (three) times daily as needed for cough. 02/10/20   Alvia Bring, DO  diclofenac  sodium (VOLTAREN ) 1 % GEL Apply 2-4 g topically 4 (four) times daily. To affected joint. 10/01/17   Alexander, Natalie, DO  escitalopram  (LEXAPRO ) 10 MG tablet Take 1 tablet (10 mg total) by mouth at bedtime. Take 0.5 tablet (5 mg total) po qhs for first week then increase to 10 mg 12/27/18   Alexander, Natalie, DO  hydrOXYzine  (ATARAX /VISTARIL ) 25 MG tablet Take 2 tablets (50 mg total) by mouth every 6  (six) hours as needed for anxiety. 12/26/18   Anitra Rocky KIDD, PA-C  Omega-3 Fatty Acids (FISH OIL PO) Take by mouth.    [provider]  rizatriptan  (MAXALT -MLT) 5 MG disintegrating tablet Take 1-2 tablets (5-10 mg total) by mouth as needed for migraine. May repeat in 2 hours if needed 02/19/18   Alexander, Natalie, DO  topiramate  (TOPAMAX ) 50 MG tablet One half tab by mouth daily for one week then one tab by mouth daily. 02/19/18   Alexander, Natalie, DO  traMADol  (ULTRAM ) 50 MG tablet Take 1-2 tablets (50-100 mg total) by mouth every 8 (eight) hours as needed for moderate pain. 10/01/17   Alexander, Natalie, DO  triamcinolone  cream (KENALOG ) 0.1 % Apply 1 application topically 2 (two) times daily. To affected area(s) as needed 10/01/17   Alexander, Natalie, DO  zolpidem  (AMBIEN ) 5 MG tablet Take 1 tablet (5 mg total) by mouth at bedtime as needed for sleep. 12/27/18   Alexander, Natalie, DO    Family History Family History  Problem Relation Age of Onset   Diabetes Maternal Grandmother    Stroke Maternal Grandfather    HIV Father    Healthy Son    Healthy Son     Social History Social History[1]   Allergies   Patient has no known allergies.   Review of Systems Review of Systems  Constitutional:  Negative for fever.  Respiratory:  Negative for cough.   Cardiovascular:  Negative for chest pain.  Gastrointestinal:  Negative for abdominal pain, constipation, diarrhea, nausea and vomiting.  Musculoskeletal:  Positive for arthralgias (Right lateral midfoot and right ankle pain) and gait problem (Pain with weightbearing). Negative for back pain.  Skin:  Negative for color change and rash.  Neurological:  Negative for syncope.  All other systems reviewed and are negative.    Physical Exam Triage Vital Signs ED Triage Vitals  Encounter Vitals Group     BP 03/11/24 1225 113/76     Girls Systolic BP Percentile --      Girls Diastolic BP Percentile --      Boys Systolic BP  Percentile --      Boys Diastolic BP Percentile --      Pulse Rate 03/11/24 1225 71     Resp 03/11/24 1225 20     Temp 03/11/24 1225 98.3 F (36.8 C)     Temp Source 03/11/24 1225 Oral     SpO2 03/11/24 1225 98 %     Weight --      Height --      Head Circumference --      Peak Flow --      Pain Score 03/11/24 1226 6     Pain Loc --  Pain Education --      Exclude from Growth Chart --    No data found.  Updated Vital Signs BP 113/76 (BP Location: Right Arm)   Pulse 71   Temp 98.3 F (36.8 C) (Oral)   Resp 20   SpO2 98%   Visual Acuity Right Eye Distance:   Left Eye Distance:   Bilateral Distance:    Right Eye Near:   Left Eye Near:    Bilateral Near:     Physical Exam Vitals and nursing note reviewed.  Constitutional:      General: He is not in acute distress.    Appearance: He is well-developed. He is not ill-appearing or toxic-appearing.  HENT:     Head: Normocephalic and atraumatic.     Right Ear: External ear normal.     Left Ear: External ear normal.     Nose: Nose normal.     Mouth/Throat:     Lips: Pink.     Mouth: Mucous membranes are moist.  Eyes:     Conjunctiva/sclera: Conjunctivae normal.     Pupils: Pupils are equal, round, and reactive to light.  Cardiovascular:     Rate and Rhythm: Normal rate and regular rhythm.     Heart sounds: S1 normal and S2 normal. No murmur heard. Pulmonary:     Effort: Pulmonary effort is normal. No respiratory distress.     Breath sounds: Normal breath sounds. No decreased breath sounds, wheezing, rhonchi or rales.  Musculoskeletal:        General: No swelling.     Right knee: Normal.     Left knee: Normal.     Right lower leg: Normal.     Left lower leg: Normal.     Right ankle: Swelling (Minimal or mild right lateral swelling below the ankle) and ecchymosis (Minimal ecchymosis below the right lateral ankle) present. No deformity or lacerations. Tenderness present over the medial malleolus. Decreased range  of motion (Due to pain). Anterior drawer test negative. Normal pulse.     Right Achilles Tendon: Normal.     Left ankle: Normal.     Left Achilles Tendon: Normal.     Right foot: Decreased range of motion (Due to pain). Normal capillary refill. Swelling (Right lateral midfoot with ecchymosis), tenderness (Right lateral midfoot) and bony tenderness (Right lateral midfoot) present. No deformity, bunion, Charcot foot, foot drop, prominent metatarsal heads, laceration or crepitus. Normal pulse.     Left foot: Normal.  Skin:    General: Skin is warm and dry.     Capillary Refill: Capillary refill takes less than 2 seconds.     Findings: No rash.  Neurological:     Mental Status: He is alert and oriented to person, place, and time.  Psychiatric:        Mood and Affect: Mood normal.      UC Treatments / Results  Labs (all labs ordered are listed, but only abnormal results are displayed) Comprehensive Metabolic Panel: 05/19/22: Order: [id number] Component Ref Range & Units 1 yr ago  Sodium 136 - 145 mmol/L 140  Potassium 3.5 - 5.1 mmol/L 3.6  Comment: NO VISIBLE HEMOLYSIS  Chloride 98 - 107 mmol/L 107  CO2 21 - 31 mmol/L 22  Anion Gap 6 - 14 mmol/L 11  Glucose, Random 70 - 99 mg/dL 828 High   Blood Urea Nitrogen (BUN) 7 - 25 mg/dL 15  Creatinine 9.29 - 8.69 mg/dL 8.96  eGFR >40 fO/fpw/8.26f7 87  Comment: GFR estimated  by CKD-EPI equations(NKF 2021).  Recommend confirmation of Cr-based eGFR by using Cys-based eGFR and other filtration markers (if applicable) in complex cases and clinical decision-making, as needed.  Albumin 3.5 - 5.7 g/dL 4.1  Total Protein 6.4 - 8.9 g/dL 6.8  Bilirubin, Total 0.3 - 1.0 mg/dL 0.2 Low   Alkaline Phosphatase (ALP) 34 - 104 U/L 54  Aspartate Aminotransferase (AST) 13 - 39 U/L 14  Alanine Aminotransferase (ALT) 7 - 52 U/L 20  Calcium 8.6 - 10.3 mg/dL 8.9  BUN/Creatinine Ratio   Comment: Creatinine is normal, ratio is not clinically  indicated.  Resulting Agency [facility](CLIA# [id number])    EKG   Radiology No results found.  Procedures Procedures (including critical Livingston time)  Medications Ordered in UC Medications  ketorolac  (TORADOL ) 30 MG/ML injection 30 mg (30 mg Intramuscular Given 03/11/24 1330)    Initial Impression / Assessment and Plan / UC Course  I have reviewed the triage vital signs and the nursing notes.  Pertinent labs & imaging results that were available during my Livingston of the patient were reviewed by me and considered in my medical decision making (see chart for details).  Plan of Livingston (see discharge instructions for additional patient precautions and education): Right foot and right ankle pain with painful gait: Ketorolac  30 mg injection now.  Ketorolac  10 mg every 6 hours with food if needed for ankle and foot pain.  Oxycodone  5 mg, 1 pill every 6 hours for severe pain.  Save the oxycodone  for night to help you be able to sleep.  Encouraged use of ice therapy (ice packs 30 minutes on and 30 minutes off and repeat as often as needed) for 24 to 48 hours.  Then switch to heat therapy (heat packs 30 minutes on and 30 minutes off and repeat as often as needed).  Encouraged elevation to reduce pain and swelling also.  Continue use of cam boot and crutches.  Follow-up with orthopedics on 03/15/2024 as planned.  I reviewed the plan of Livingston with the patient and/or the patient's guardian.  The patient and/or guardian had time to ask questions and acknowledged that the questions were answered.  Final Clinical Impressions(s) / UC Diagnoses   Final diagnoses:  Right foot pain  Painful gait  Acute right ankle pain     Discharge Instructions      Right foot and right ankle pain with painful gait: Ketorolac  30 mg injection now.  Ketorolac  10 mg every 6 hours with food if needed for ankle and foot pain.  Oxycodone  5 mg, 1 pill every 6 hours for severe pain.  Save the  oxycodone  for night to help you be able to sleep.  Encouraged use of ice therapy (ice packs 30 minutes on and 30 minutes off and repeat as often as needed) for 24 to 48 hours.  Then switch to heat therapy (heat packs 30 minutes on and 30 minutes off and repeat as often as needed).  Encouraged elevation to reduce pain and swelling also.  Continue use of cam boot and crutches.  Follow-up with orthopedics on 03/15/2024 as planned.     ED Prescriptions     Medication Sig Dispense Auth. Provider   ketorolac  (TORADOL ) 10 MG tablet Take 1 tablet (10 mg total) by mouth every 6 (six) hours as needed. 20 tablet Jj Enyeart, FNP   oxyCODONE  (ROXICODONE ) 5 MG immediate release tablet Take 1 tablet (5 mg total) by mouth every 6 (six) hours as  needed for severe pain (pain score 7-10). 4 tablet Margy Sumler, FNP      I have reviewed the PDMP during this encounter.    [1]  Social History Tobacco Use   Smoking status: Former    Current packs/day: 0.00    Types: Cigarettes    Quit date: 1989    Years since quitting: 37.0   Smokeless tobacco: Never  Vaping Use   Vaping status: Never Used  Substance Use Topics   Alcohol use: No   Drug use: No     Ival Domino, FNP 03/11/24 1335  "

## 2024-03-11 NOTE — Discharge Instructions (Addendum)
 Right foot and right ankle pain with painful gait: Ketorolac  30 mg injection now.  Ketorolac  10 mg every 6 hours with food if needed for ankle and foot pain.  Oxycodone  5 mg, 1 pill every 6 hours for severe pain.  Save the oxycodone  for night to help you be able to sleep.  Encouraged use of ice therapy (ice packs 30 minutes on and 30 minutes off and repeat as often as needed) for 24 to 48 hours.  Then switch to heat therapy (heat packs 30 minutes on and 30 minutes off and repeat as often as needed).  Encouraged elevation to reduce pain and swelling also.  Continue use of cam boot and crutches.  Follow-up with orthopedics on 03/15/2024 as planned.
